# Patient Record
Sex: Female | Born: 1975
Health system: Southern US, Community
[De-identification: ages and names within clinical notes are randomized; demographics above are authoritative.]

## PROBLEM LIST (undated history)

## (undated) DIAGNOSIS — E039 Hypothyroidism, unspecified: Secondary | ICD-10-CM

## (undated) DIAGNOSIS — F419 Anxiety disorder, unspecified: Secondary | ICD-10-CM

## (undated) DIAGNOSIS — Z8739 Personal history of other diseases of the musculoskeletal system and connective tissue: Secondary | ICD-10-CM

## (undated) DIAGNOSIS — M797 Fibromyalgia: Secondary | ICD-10-CM

## (undated) DIAGNOSIS — Z8719 Personal history of other diseases of the digestive system: Secondary | ICD-10-CM

## (undated) DIAGNOSIS — N200 Calculus of kidney: Secondary | ICD-10-CM

## (undated) DIAGNOSIS — K219 Gastro-esophageal reflux disease without esophagitis: Secondary | ICD-10-CM

## (undated) DIAGNOSIS — R51 Headache: Secondary | ICD-10-CM

## (undated) DIAGNOSIS — I10 Essential (primary) hypertension: Secondary | ICD-10-CM

## (undated) HISTORY — PX: ABDOMINAL HYSTERECTOMY: SHX81

## (undated) HISTORY — PX: TOTAL THYROIDECTOMY: SHX2547

## (undated) HISTORY — PX: LITHOTRIPSY: SUR834

## (undated) HISTORY — PX: CHOLECYSTECTOMY: SHX55

---

## 2004-04-29 ENCOUNTER — Encounter: Admission: RE | Admit: 2004-04-29 | Discharge: 2004-04-29 | Payer: Self-pay | Admitting: *Deleted

## 2004-08-12 ENCOUNTER — Encounter: Admission: RE | Admit: 2004-08-12 | Discharge: 2004-08-12 | Payer: Self-pay | Admitting: Neurosurgery

## 2004-09-05 ENCOUNTER — Encounter: Admission: RE | Admit: 2004-09-05 | Discharge: 2004-09-05 | Payer: Self-pay | Admitting: Neurosurgery

## 2004-11-28 ENCOUNTER — Encounter: Admission: RE | Admit: 2004-11-28 | Discharge: 2004-11-28 | Payer: Self-pay | Admitting: Neurosurgery

## 2006-01-18 ENCOUNTER — Ambulatory Visit (HOSPITAL_COMMUNITY): Admission: RE | Admit: 2006-01-18 | Discharge: 2006-01-18 | Payer: Self-pay | Admitting: *Deleted

## 2006-06-11 ENCOUNTER — Emergency Department (HOSPITAL_COMMUNITY): Admission: EM | Admit: 2006-06-11 | Discharge: 2006-06-11 | Payer: Self-pay | Admitting: Emergency Medicine

## 2007-02-01 ENCOUNTER — Encounter: Admission: RE | Admit: 2007-02-01 | Discharge: 2007-02-01 | Payer: Self-pay | Admitting: Neurosurgery

## 2008-07-03 ENCOUNTER — Ambulatory Visit (HOSPITAL_COMMUNITY): Admission: RE | Admit: 2008-07-03 | Discharge: 2008-07-03 | Payer: Self-pay | Admitting: Urology

## 2008-08-07 ENCOUNTER — Encounter: Admission: RE | Admit: 2008-08-07 | Discharge: 2008-08-07 | Payer: Self-pay | Admitting: Neurosurgery

## 2008-08-20 ENCOUNTER — Encounter: Admission: RE | Admit: 2008-08-20 | Discharge: 2008-08-20 | Payer: Self-pay | Admitting: Neurosurgery

## 2008-09-15 ENCOUNTER — Encounter: Admission: RE | Admit: 2008-09-15 | Discharge: 2008-09-15 | Payer: Self-pay | Admitting: Otolaryngology

## 2009-02-12 ENCOUNTER — Encounter: Admission: RE | Admit: 2009-02-12 | Discharge: 2009-02-12 | Payer: Self-pay | Admitting: Neurosurgery

## 2009-02-19 ENCOUNTER — Encounter: Payer: Self-pay | Admitting: Endocrinology

## 2009-02-25 ENCOUNTER — Encounter: Admission: RE | Admit: 2009-02-25 | Discharge: 2009-02-25 | Payer: Self-pay | Admitting: Endocrinology

## 2009-03-25 ENCOUNTER — Encounter: Payer: Self-pay | Admitting: Endocrinology

## 2009-03-26 ENCOUNTER — Encounter: Admission: RE | Admit: 2009-03-26 | Discharge: 2009-03-26 | Payer: Self-pay | Admitting: Endocrinology

## 2009-04-14 ENCOUNTER — Other Ambulatory Visit: Admission: RE | Admit: 2009-04-14 | Discharge: 2009-04-14 | Payer: Self-pay | Admitting: Endocrinology

## 2009-04-26 IMAGING — CT CT NECK W/ CM
3 of 4 series · 16 of 33 positions shown, 19 images · IV contrast (agent unspecified)
Comparison: 01/18/2006

CLINICAL DATA: Submental mass

CT NECK WITH CONTRAST
TECHNIQUE: Multidetector CT imaging of the neck was performed with
intravenous contrast.
Contrast: 75 ml Fmnipaque-AZZ

[Series 2: neck w/ · axial · 0.39mm/px · z∈[+19,+214]mm · 8 of 66 slices shown, 10 images]
[im 7/66  soft-tissue]
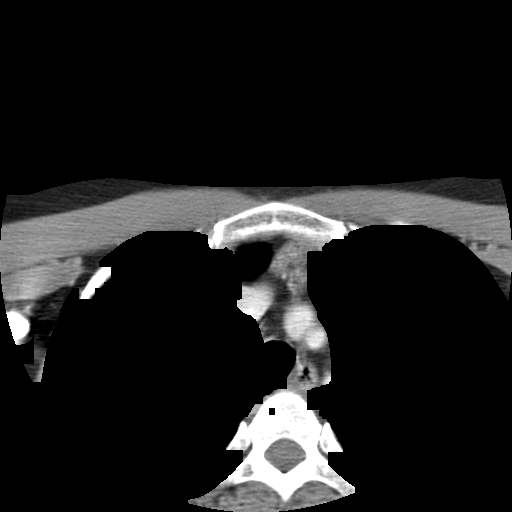
[im 7/66  bone]
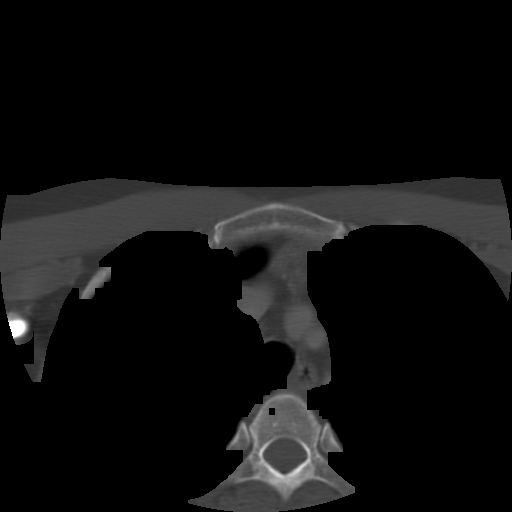
[im 14/66  bone]
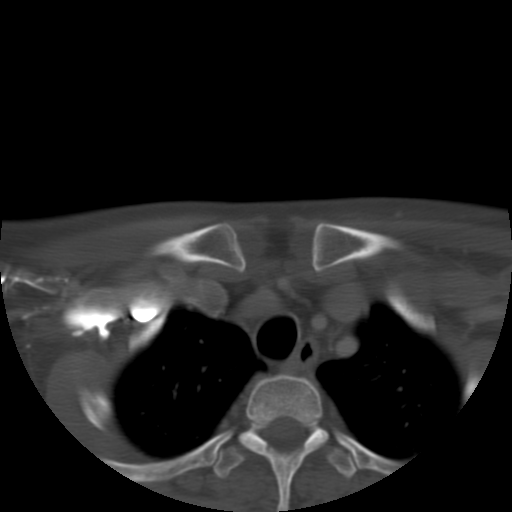
[im 20/66  bone]
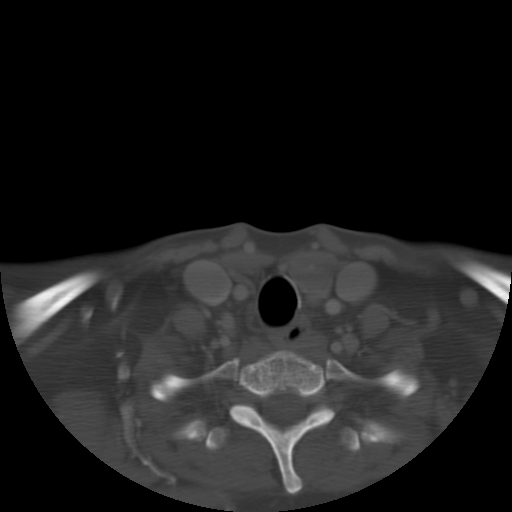
[im 27/66  bone]
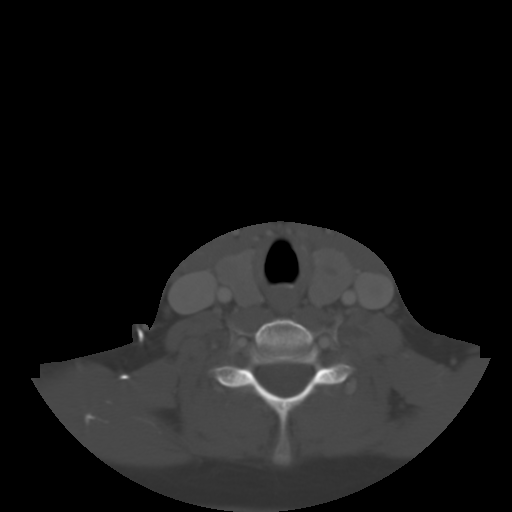
[im 40/66  soft-tissue]
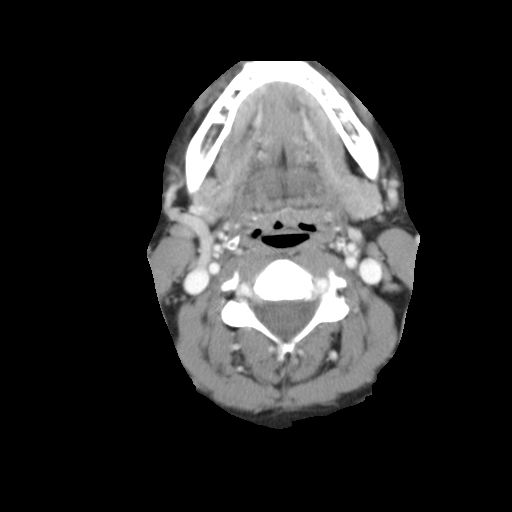
[im 40/66  bone]
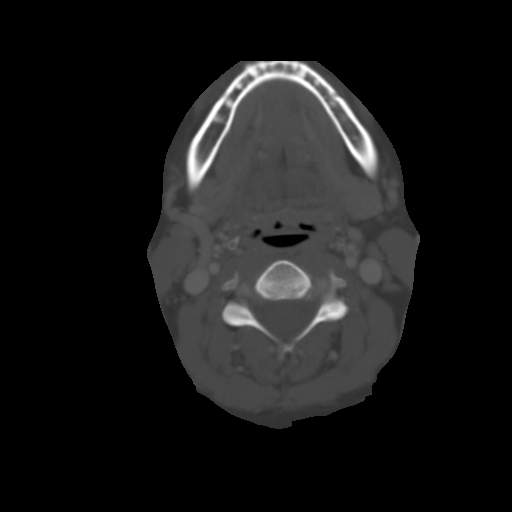
[im 46/66  bone]
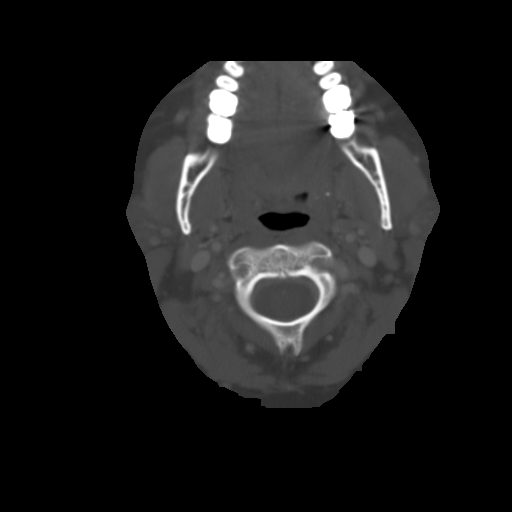
[im 53/66  bone]
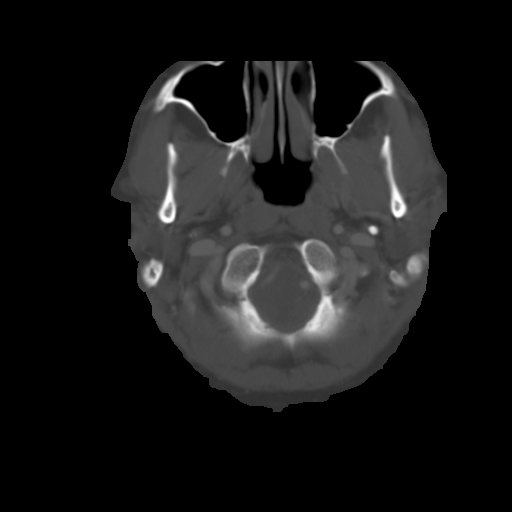
[im 59/66  bone]
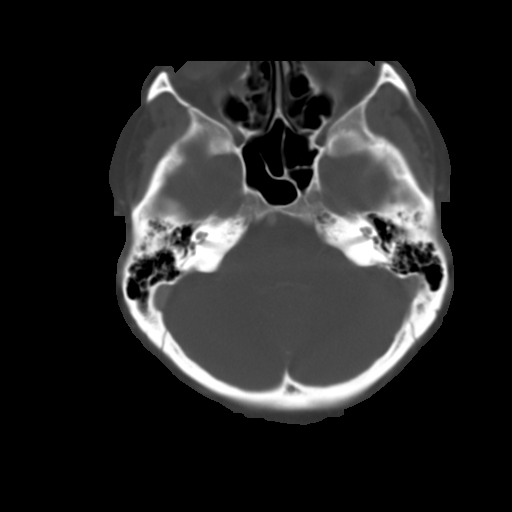

[Series 400: sagittal · sagittal · 0.49mm/px · 5 of 73 slices shown, 6 images]
[im 25/73  bone]
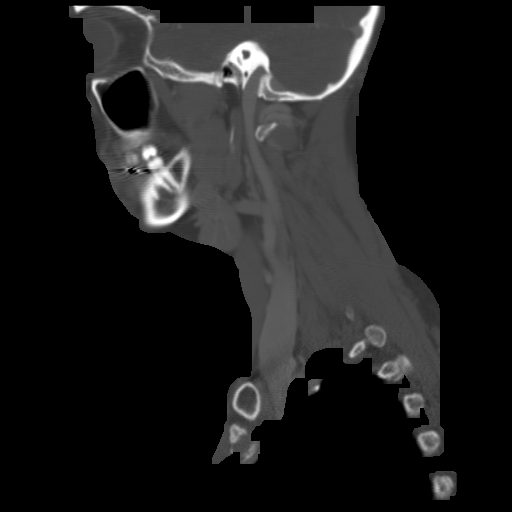
[im 31/73  bone]
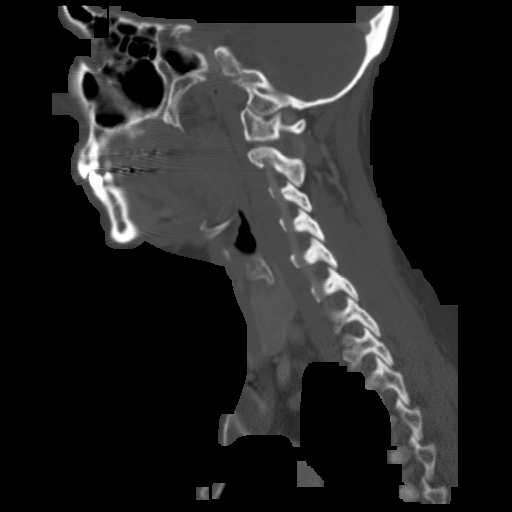
[im 37/73  soft-tissue]
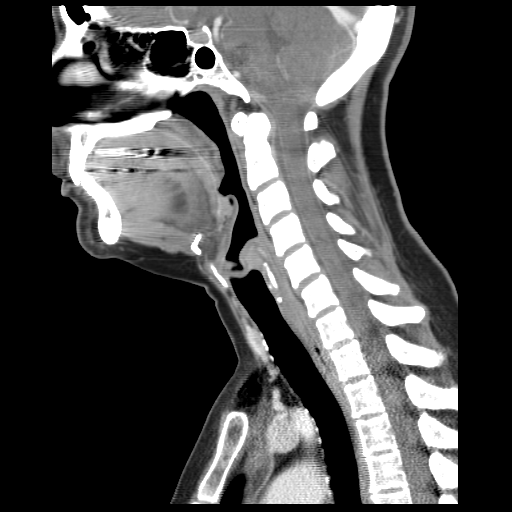
[im 37/73  bone]
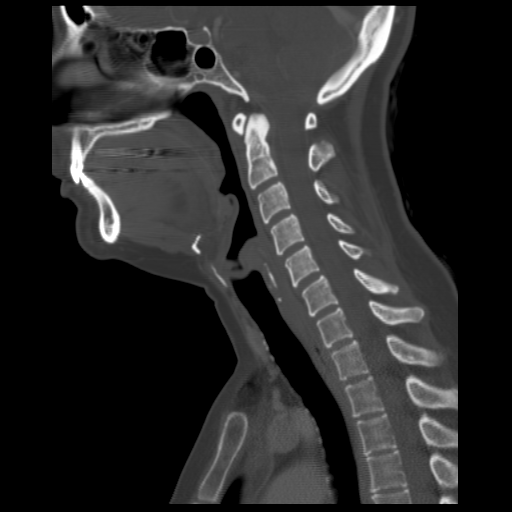
[im 43/73  bone]
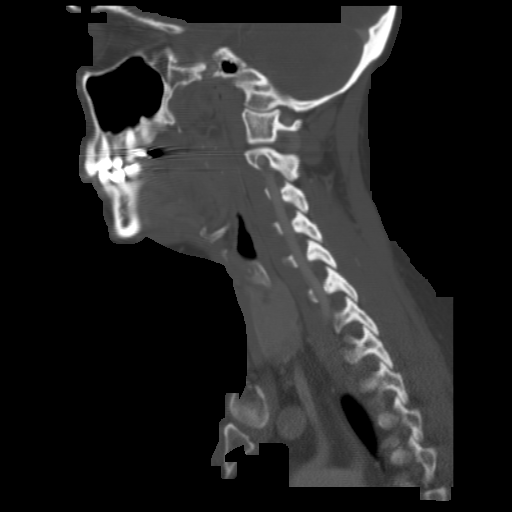
[im 49/73  bone]
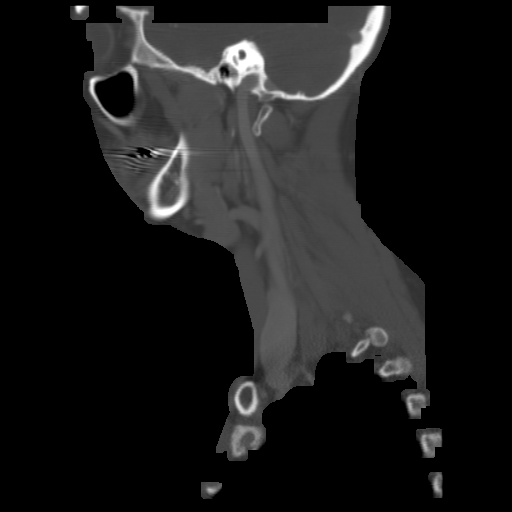

[Series 401: coronal · coronal · 0.49mm/px · 3 of 73 slices shown]
[im 15/73  bone]
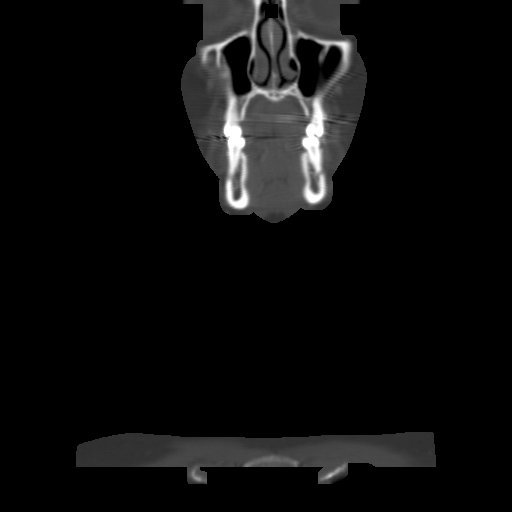
[im 29/73  bone]
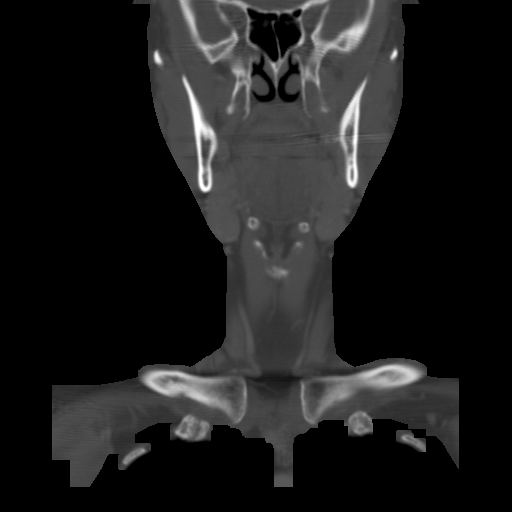
[im 44/73  bone]
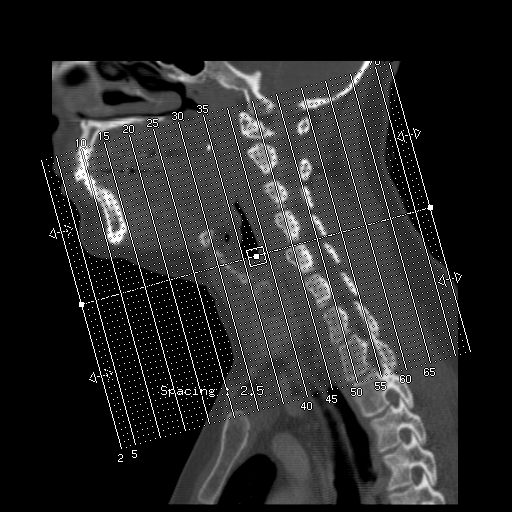

[16 of 33 positions shown; findings below may reference images not displayed]

FINDINGS: Parotid and submandibular glands appear normal.  The
thyroid gland is enlarged and heterogeneous consistent with
multinodular goiter.  These changes were present in 3883.

No pathologically enlarged lymph nodes are seen.  No sign of
submental mass.  No mucosal or submucosal lesion is appreciated.
Vascular structures appear unremarkable.

Visualized intracranial contents appear normal.  Lung apices are
clear.  No superior mediastinal pathology.
IMPRESSION: No submental mass identified.  No sign of cyst, node or other
lesion.

Probable early changes of multinodular goiter.

## 2009-05-28 ENCOUNTER — Encounter: Payer: Self-pay | Admitting: Endocrinology

## 2009-06-08 ENCOUNTER — Encounter: Payer: Self-pay | Admitting: Endocrinology

## 2009-06-11 ENCOUNTER — Encounter: Payer: Self-pay | Admitting: Endocrinology

## 2009-07-30 ENCOUNTER — Ambulatory Visit: Payer: Self-pay | Admitting: Endocrinology

## 2009-07-30 DIAGNOSIS — G43909 Migraine, unspecified, not intractable, without status migrainosus: Secondary | ICD-10-CM | POA: Insufficient documentation

## 2009-07-30 DIAGNOSIS — E039 Hypothyroidism, unspecified: Secondary | ICD-10-CM | POA: Insufficient documentation

## 2009-07-30 DIAGNOSIS — E063 Autoimmune thyroiditis: Secondary | ICD-10-CM | POA: Insufficient documentation

## 2009-07-30 DIAGNOSIS — R599 Enlarged lymph nodes, unspecified: Secondary | ICD-10-CM | POA: Insufficient documentation

## 2009-07-30 DIAGNOSIS — F411 Generalized anxiety disorder: Secondary | ICD-10-CM | POA: Insufficient documentation

## 2009-11-04 IMAGING — US US SOFT TISSUE HEAD/NECK
1 series · 13 of 25 positions shown · non-contrast
Comparison: Ultrasound of the thyroid of 02/25/2009

CLINICAL DATA: Enlarged thyroid on physical exam, follow-up,
patient on Synthroid for over a year

THYROID ULTRASOUND
TECHNIQUE: Ultrasound examination of the thyroid gland and
adjacent soft tissues was performed.

[Series 1: us soft tissue head/neck · 0.09mm/px · 13 of 85 slices shown]
[im 1/85]
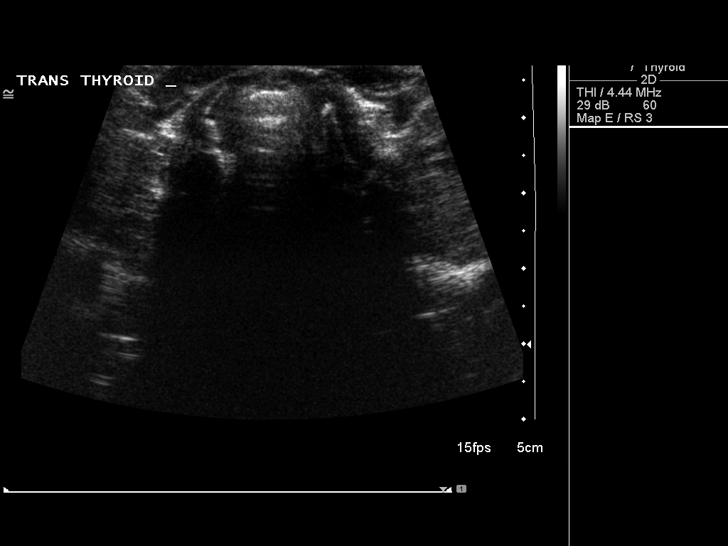
[im 8/85]
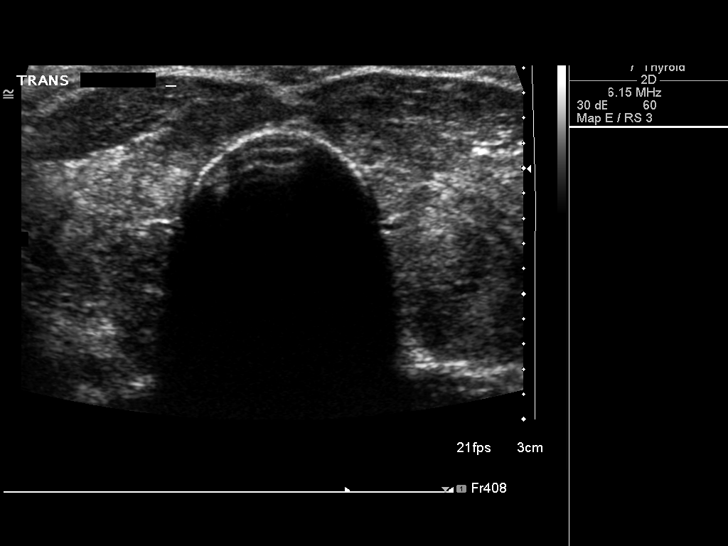
[im 15/85]
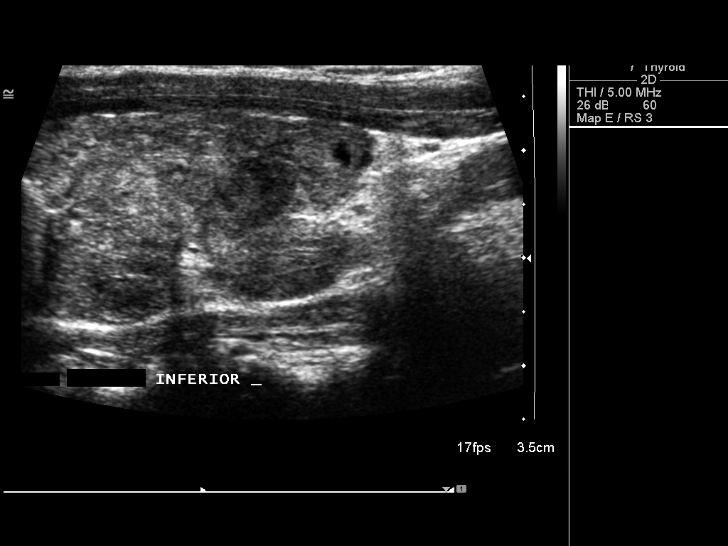
[im 22/85]
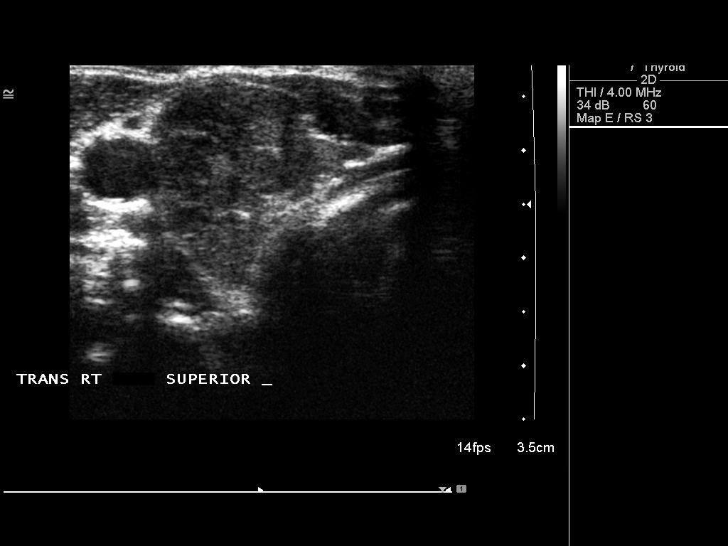
[im 29/85]
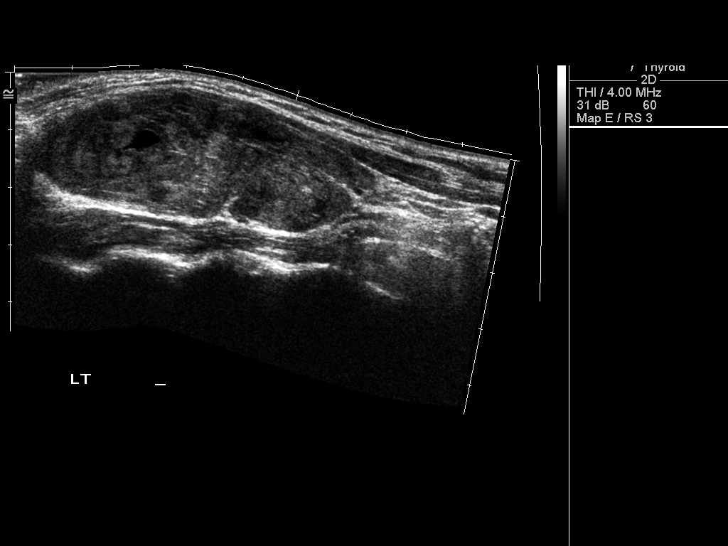
[im 36/85]
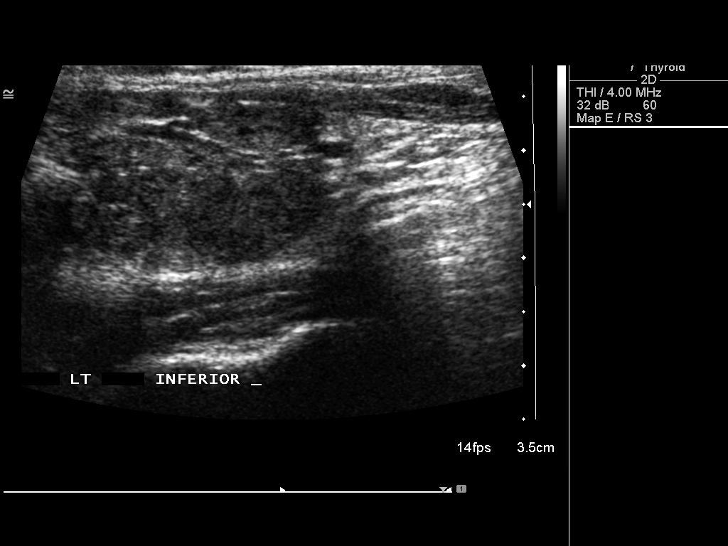
[im 43/85]
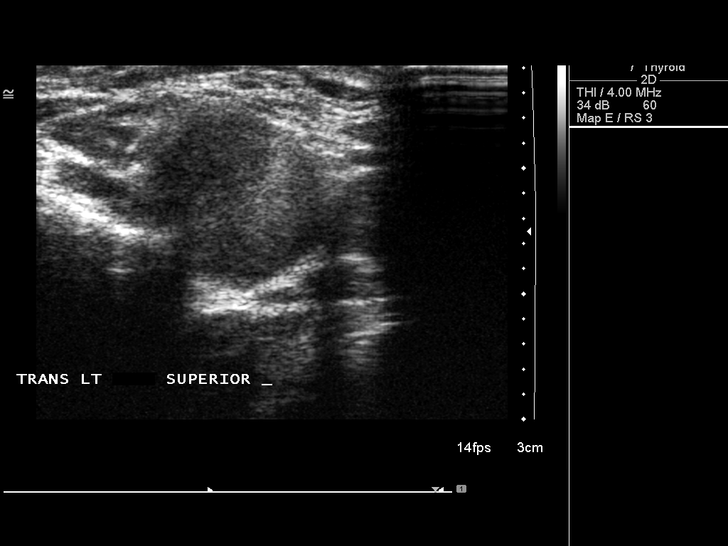
[im 50/85]
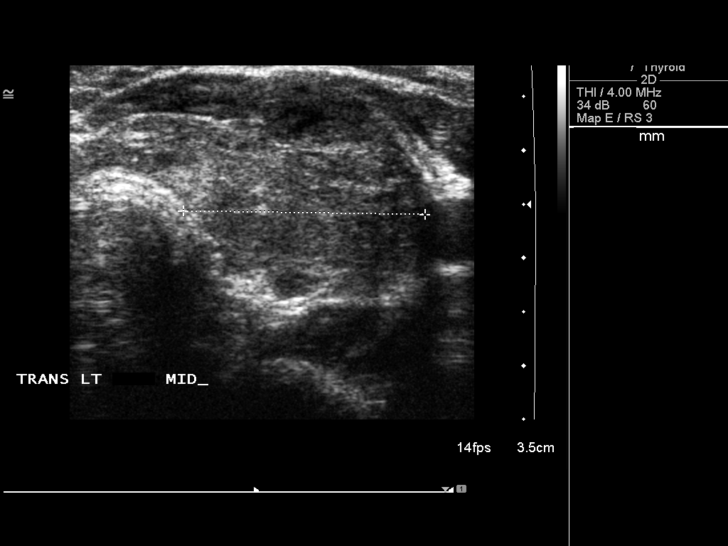
[im 57/85]
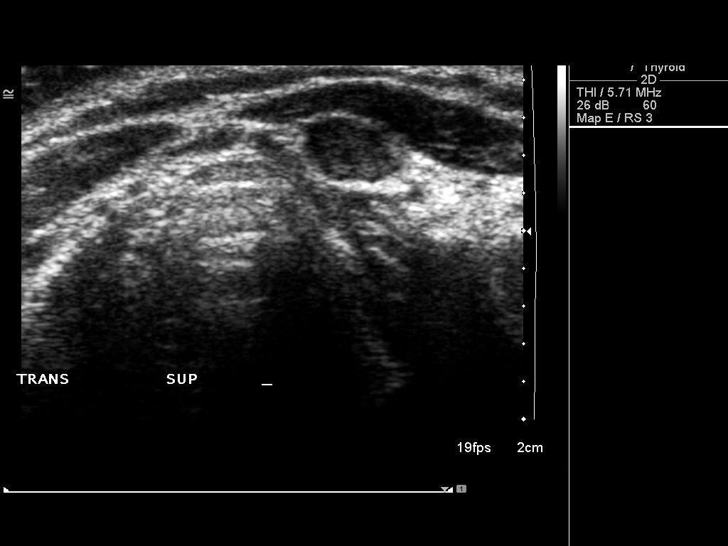
[im 64/85]
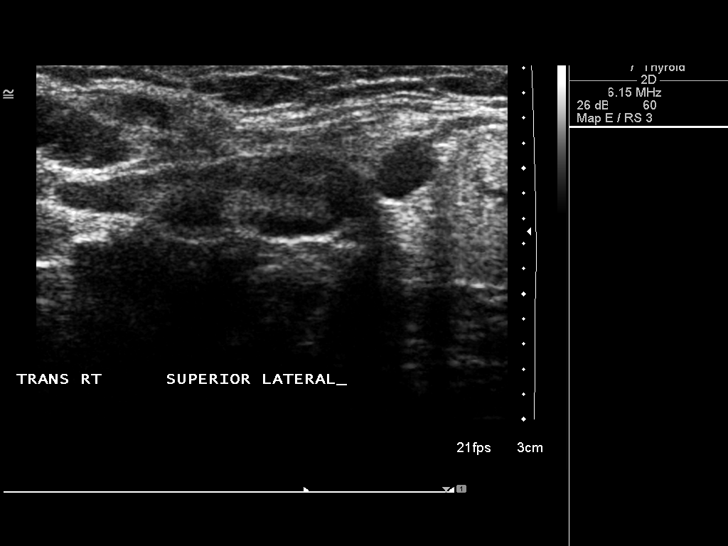
[im 71/85]
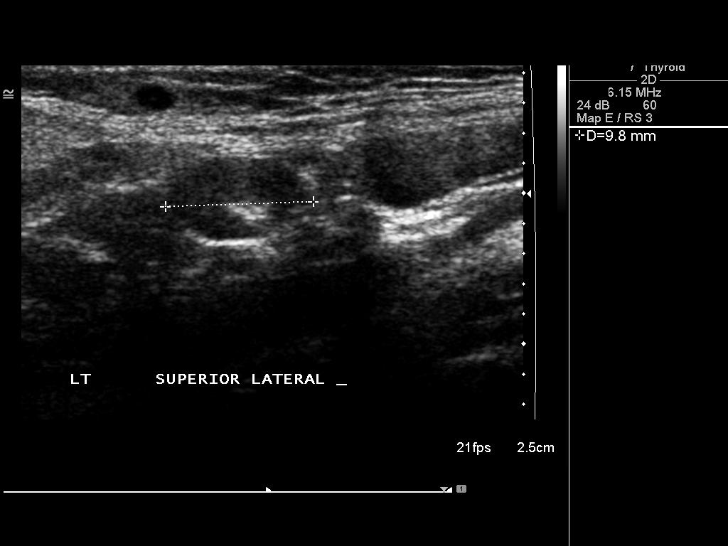
[im 78/85]
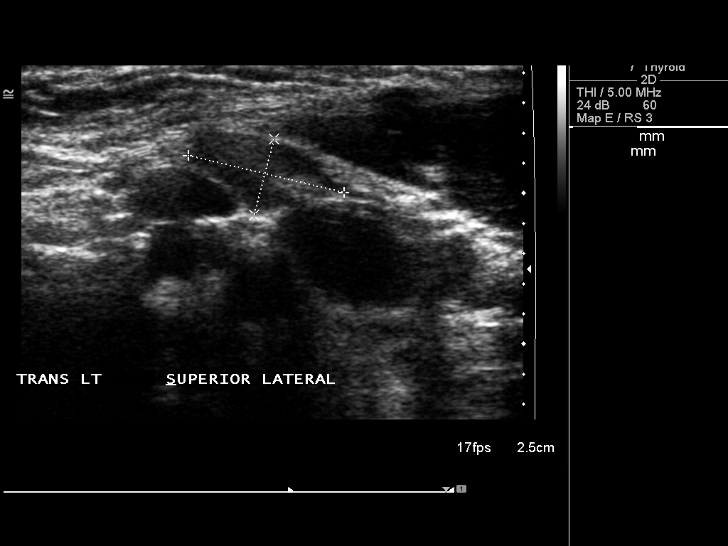
[im 85/85]
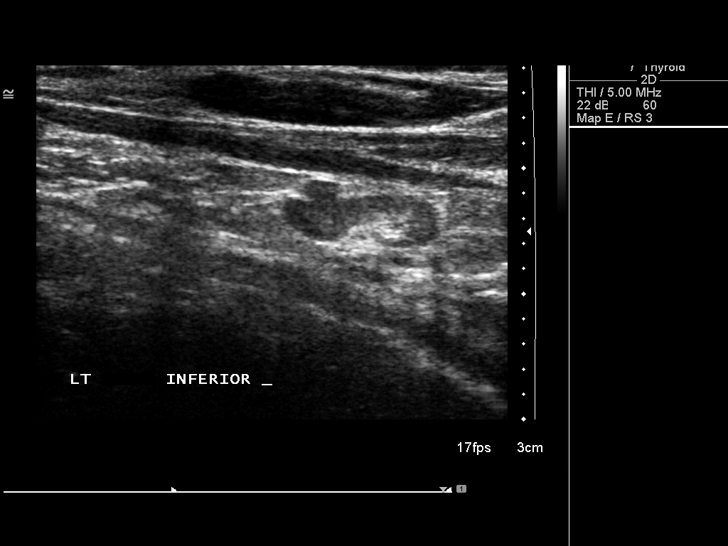

[13 of 25 positions shown; findings below may reference images not displayed]

FINDINGS: The thyroid gland is stable in size.  The right lobe
measures 5.1 x 2.1 x 1.9 cm.  (Prior measurements of 5.1 x 2.0 x
1.8 cm).  The left lobe measures currently 6.1 x 2.3 x 2.3 cm.
(Prior measurements of 5.8 x 2.1 x 2.5 cm.) The isthmus is stable
measuring 4.8 mm in thickness. The gland is very inhomogeneous and
somewhat hypervascular.  A poorly defined hypoechogenic focus is
noted in the upper pole of the left lobe of approximately 10 mm in
maximum diameter.

Prominent lymph nodes are again noted in the neck.  The largest on
the right has increased somewhat in size measuring 2.5 x 0.7 x
cm superior to the thyroid.  The largest on the left also superior
to the thyroid measures 1.9 x 0.5 x 1.1 cm.
IMPRESSION: 1.  No significant change in the size of the thyroid gland which is
nodular, irregular, and hypervascular.  There is a new complex
cystic area in the upper pole of the left of 10 mm in diameter.
 2.  Prominent lymph nodes in the neck as previously noted.

## 2010-01-11 ENCOUNTER — Ambulatory Visit (HOSPITAL_COMMUNITY): Admission: RE | Admit: 2010-01-11 | Discharge: 2010-01-11 | Payer: Self-pay | Admitting: Urology

## 2010-11-11 ENCOUNTER — Encounter
Admission: RE | Admit: 2010-11-11 | Discharge: 2010-11-11 | Payer: Self-pay | Source: Home / Self Care | Attending: Family Medicine | Admitting: Family Medicine

## 2010-11-27 ENCOUNTER — Encounter: Payer: Self-pay | Admitting: Neurosurgery

## 2010-12-02 ENCOUNTER — Encounter
Admission: RE | Admit: 2010-12-02 | Discharge: 2010-12-02 | Payer: Self-pay | Source: Home / Self Care | Attending: Family Medicine | Admitting: Family Medicine

## 2011-04-25 ENCOUNTER — Other Ambulatory Visit: Payer: Self-pay | Admitting: Family Medicine

## 2011-04-25 DIAGNOSIS — M549 Dorsalgia, unspecified: Secondary | ICD-10-CM

## 2011-04-27 ENCOUNTER — Ambulatory Visit
Admission: RE | Admit: 2011-04-27 | Discharge: 2011-04-27 | Disposition: A | Discharge status: Home / Self Care | Payer: BC Managed Care – PPO | Source: Ambulatory Visit | Attending: Family Medicine | Admitting: Family Medicine

## 2011-04-27 DIAGNOSIS — M549 Dorsalgia, unspecified: Secondary | ICD-10-CM

## 2012-01-11 ENCOUNTER — Other Ambulatory Visit: Payer: Self-pay | Admitting: Family Medicine

## 2012-01-11 DIAGNOSIS — M545 Low back pain, unspecified: Secondary | ICD-10-CM

## 2012-02-02 ENCOUNTER — Other Ambulatory Visit: Payer: BC Managed Care – PPO

## 2012-02-29 ENCOUNTER — Other Ambulatory Visit: Payer: BC Managed Care – PPO

## 2012-03-13 ENCOUNTER — Other Ambulatory Visit: Payer: BC Managed Care – PPO

## 2012-03-15 ENCOUNTER — Other Ambulatory Visit: Payer: BC Managed Care – PPO

## 2012-03-15 ENCOUNTER — Ambulatory Visit
Admission: RE | Admit: 2012-03-15 | Discharge: 2012-03-15 | Disposition: A | Discharge status: Home / Self Care | Payer: BC Managed Care – PPO | Source: Ambulatory Visit | Attending: Family Medicine | Admitting: Family Medicine

## 2012-03-15 DIAGNOSIS — M545 Low back pain, unspecified: Secondary | ICD-10-CM

## 2012-03-15 MED ORDER — METHYLPREDNISOLONE ACETATE 40 MG/ML INJ SUSP (RADIOLOG
120.0000 mg | Freq: Once | INTRAMUSCULAR | Status: AC
  Administered 2012-03-15: 120 mg via EPIDURAL

## 2012-03-15 MED ORDER — IOHEXOL 180 MG/ML  SOLN
1.0000 mL | Freq: Once | INTRAMUSCULAR | Status: AC | PRN
  Administered 2012-03-15: 1 mL via EPIDURAL

## 2012-03-15 NOTE — Discharge Instructions (Signed)

## 2013-06-16 ENCOUNTER — Other Ambulatory Visit: Payer: Self-pay | Admitting: Family Medicine

## 2013-06-16 DIAGNOSIS — M5136 Other intervertebral disc degeneration, lumbar region: Secondary | ICD-10-CM

## 2013-06-24 ENCOUNTER — Ambulatory Visit
Admission: RE | Admit: 2013-06-24 | Discharge: 2013-06-24 | Disposition: A | Discharge status: Home / Self Care | Payer: BC Managed Care – PPO | Source: Ambulatory Visit | Attending: Family Medicine | Admitting: Family Medicine

## 2013-06-24 DIAGNOSIS — M5136 Other intervertebral disc degeneration, lumbar region: Secondary | ICD-10-CM

## 2013-06-24 MED ORDER — IOHEXOL 180 MG/ML  SOLN
1.0000 mL | Freq: Once | INTRAMUSCULAR | Status: AC | PRN
  Administered 2013-06-24: 1 mL via EPIDURAL

## 2013-06-24 MED ORDER — METHYLPREDNISOLONE ACETATE 40 MG/ML INJ SUSP (RADIOLOG
120.0000 mg | Freq: Once | INTRAMUSCULAR | Status: AC
  Administered 2013-06-24: 120 mg via EPIDURAL

## 2014-05-25 ENCOUNTER — Encounter (HOSPITAL_COMMUNITY): Payer: Self-pay | Admitting: Pharmacy Technician

## 2014-06-02 ENCOUNTER — Encounter (HOSPITAL_COMMUNITY)
Admission: RE | Admit: 2014-06-02 | Discharge: 2014-06-02 | Disposition: A | Discharge status: Home / Self Care | Payer: BC Managed Care – PPO | Source: Ambulatory Visit | Attending: Obstetrics & Gynecology | Admitting: Obstetrics & Gynecology

## 2014-06-02 ENCOUNTER — Encounter (HOSPITAL_COMMUNITY): Payer: Self-pay

## 2014-06-02 HISTORY — DX: Gastro-esophageal reflux disease without esophagitis: K21.9

## 2014-06-02 HISTORY — DX: Hypothyroidism, unspecified: E03.9

## 2014-06-02 HISTORY — DX: Fibromyalgia: M79.7

## 2014-06-02 HISTORY — DX: Essential (primary) hypertension: I10

## 2014-06-02 HISTORY — DX: Personal history of other diseases of the digestive system: Z87.19

## 2014-06-02 HISTORY — DX: Headache: R51

## 2014-06-02 HISTORY — DX: Calculus of kidney: N20.0

## 2014-06-02 HISTORY — DX: Anxiety disorder, unspecified: F41.9

## 2014-06-02 HISTORY — DX: Personal history of other diseases of the musculoskeletal system and connective tissue: Z87.39

## 2014-06-02 LAB — CBC
HCT: 44.4 % (ref 36.0–46.0)
Hemoglobin: 14.9 g/dL (ref 12.0–15.0)
MCH: 31.6 pg (ref 26.0–34.0)
MCHC: 33.6 g/dL (ref 30.0–36.0)
MCV: 94.3 fL (ref 78.0–100.0)
PLATELETS: 189 10*3/uL (ref 150–400)
RBC: 4.71 MIL/uL (ref 3.87–5.11)
RDW: 12.1 % (ref 11.5–15.5)
WBC: 9.9 10*3/uL (ref 4.0–10.5)

## 2014-06-02 LAB — BASIC METABOLIC PANEL
ANION GAP: 9 (ref 5–15)
BUN: 12 mg/dL (ref 6–23)
CALCIUM: 9.6 mg/dL (ref 8.4–10.5)
CO2: 29 meq/L (ref 19–32)
Chloride: 101 mEq/L (ref 96–112)
Creatinine, Ser: 0.67 mg/dL (ref 0.50–1.10)
GFR calc Af Amer: >90 mL/min (ref >90)
GFR calc non Af Amer: >90 mL/min (ref >90)
Glucose, Bld: 103 mg/dL — ABNORMAL HIGH (ref 70–99)
POTASSIUM: 4.2 meq/L (ref 3.7–5.3)
SODIUM: 139 meq/L (ref 137–147)

## 2014-06-02 NOTE — Patient Instructions (Signed)
Your procedure is scheduled on:06/05/14  Enter through the Main Entrance at :6am Pick up desk phone and dial [phone] and inform us of your arrival.  Please call [phone] if you have any problems the morning of surgery.  Remember: Do not eat food or drink liquids, including water, after midnight:Thursday    You may brush your teeth the morning of surgery.  Take these meds the morning of surgery with a sip of water:thyroid pill, BP pill, GERD pill  DO NOT wear jewelry, eye make-up, lipstick,body lotion, or dark fingernail polish.  (Polished toes are ok) You may wear deodorant.   Patients discharged on the day of surgery will not be allowed to drive home. Wear loose fitting, comfortable clothes for your ride home.

## 2014-06-03 NOTE — H&P (Signed)
Melinda Cannon is an 38 y.o. female with known endometriosis and dyspareunia.  Pelvic ultrasound shows 2.5 cm left adnexal mass and 1 cm vaginal cuff nodule.  Pain did not improve with hormonal therapy (OCPs and Lupron Depot).  She desires surgical management.  Pertinent Gynecological History: Menses: s/p hysterectomy Bleeding: n/a Contraception: status post hysterectomy DES exposure: unknown Blood transfusions: none Sexually transmitted diseases: no past history Previous GYN Procedures: hysterectomy  Last mammogram: none Date: none Last pap: normal Date: 02/2013 OB History: G2, P2  Menstrual History: Menarche age: n/a No LMP recorded. Patient has had a hysterectomy.    Past Medical History  Diagnosis Date  . Kidney stones   . Hypertension   . Hypothyroidism   . GERD (gastroesophageal reflux disease)   . H/O hiatal hernia   . Headache(784.0)   . H/O calcium pyrophosphate deposition disease (CPPD)     causes migraines  . Fibromyalgia   . Anxiety     Past Surgical History  Procedure Laterality Date  . Cholecystectomy    . Total thyroidectomy    . Abdominal hysterectomy    . Lithotripsy      No family history on file.  Social History:  reports that she has been smoking Cigarettes.  She has a 12.5 pack-year smoking history. She has never used smokeless tobacco. She reports that she does not drink alcohol or use illicit drugs.  Allergies:  Allergies  Allergen Reactions  . Ciprofloxacin Rash and Other (See Comments)    Made her feel faint  . Codeine Rash  . Penicillins Rash    No prescriptions prior to admission    ROS  There were no vitals taken for this visit. Physical Exam  Constitutional: She is oriented to person, place, and time. She appears well-developed and well-nourished.  GI: Soft. There is no rebound and no guarding.  Neurological: She is alert and oriented to person, place, and time.  Skin: Skin is warm and dry.  Psychiatric: She has a  normal mood and affect. Her behavior is normal.    No results found for this or any previous visit (from the past 24 hour(s)).  No results found.  Assessment/Plan: 38 yo G2P2 with left adnexal mass and vaginal cuff lesion -Abdominal LSO and removal of vaginal cuff lesion  Tell Rozelle 06/03/2014, 9:06 PM

## 2014-06-04 ENCOUNTER — Encounter (HOSPITAL_COMMUNITY): Payer: Self-pay | Admitting: Anesthesiology

## 2014-06-04 ENCOUNTER — Other Ambulatory Visit (HOSPITAL_COMMUNITY): Payer: BC Managed Care – PPO

## 2014-06-04 MED ORDER — CEFAZOLIN SODIUM-DEXTROSE 2-3 GM-% IV SOLR
2.0000 g | INTRAVENOUS | Status: AC
  Administered 2014-06-05: 2 g via INTRAVENOUS

## 2014-06-04 NOTE — Anesthesia Preprocedure Evaluation (Signed)
Anesthesia Evaluation  Patient identified by MRN, date of birth, ID band Patient awake    Reviewed: Allergy & Precautions, H&P , NPO status , Patient's Chart, lab work & pertinent test results  Airway Mallampati: II TM Distance: >3 FB Neck ROM: Full    Dental no notable dental hx. (+) Teeth Intact   Pulmonary Current Smoker,  breath sounds clear to auscultation  Pulmonary exam normal       Cardiovascular hypertension, Pt. on medications Rhythm:Regular Rate:Normal     Neuro/Psych  Headaches, Anxiety  Neuromuscular disease    GI/Hepatic Neg liver ROS, hiatal hernia, GERD-  Medicated and Controlled,  Endo/Other  Hypothyroidism CPPD ( Calcium Pyrophosphate Disease ) Hx/o Hashimoto's Thyroiditis  Renal/GU Renal diseaseHx/o Renal Calculi  negative genitourinary   Musculoskeletal  (+) Fibromyalgia -  Abdominal   Peds  Hematology   Anesthesia Other Findings   Reproductive/Obstetrics Vaginal cuff lesion Endometriosis Pelvic pain                           Anesthesia Physical Anesthesia Plan  ASA: II  Anesthesia Plan: General   Post-op Pain Management:    Induction: Intravenous and Cricoid pressure planned  Airway Management Planned: Oral ETT  Additional Equipment:   Intra-op Plan:   Post-operative Plan: Extubation in OR  Informed Consent: I have reviewed the patients History and Physical, chart, labs and discussed the procedure including the risks, benefits and alternatives for the proposed anesthesia with the patient or authorized representative who has indicated his/her understanding and acceptance.   Dental advisory given  Plan Discussed with: CRNA, Anesthesiologist and Surgeon  Anesthesia Plan Comments:         Anesthesia Quick Evaluation

## 2014-06-05 ENCOUNTER — Encounter (HOSPITAL_COMMUNITY): Payer: Self-pay

## 2014-06-05 ENCOUNTER — Encounter (HOSPITAL_COMMUNITY)
Admission: RE | Disposition: A | Discharge status: Home / Self Care | Payer: Self-pay | Source: Ambulatory Visit | Attending: Obstetrics & Gynecology

## 2014-06-05 ENCOUNTER — Inpatient Hospital Stay (HOSPITAL_COMMUNITY)
Admission: RE | Admit: 2014-06-05 | Discharge: 2014-06-06 | DRG: 743 | Disposition: A | Discharge status: Home / Self Care | Payer: BC Managed Care – PPO | Source: Ambulatory Visit | Attending: Obstetrics & Gynecology | Admitting: Obstetrics & Gynecology

## 2014-06-05 ENCOUNTER — Encounter (HOSPITAL_COMMUNITY): Payer: BC Managed Care – PPO | Admitting: Anesthesiology

## 2014-06-05 ENCOUNTER — Ambulatory Visit (HOSPITAL_COMMUNITY): Payer: BC Managed Care – PPO | Admitting: Anesthesiology

## 2014-06-05 DIAGNOSIS — N83209 Unspecified ovarian cyst, unspecified side: Secondary | ICD-10-CM | POA: Diagnosis present

## 2014-06-05 DIAGNOSIS — F17200 Nicotine dependence, unspecified, uncomplicated: Secondary | ICD-10-CM | POA: Diagnosis present

## 2014-06-05 DIAGNOSIS — IMO0002 Reserved for concepts with insufficient information to code with codable children: Secondary | ICD-10-CM | POA: Diagnosis present

## 2014-06-05 DIAGNOSIS — Z87442 Personal history of urinary calculi: Secondary | ICD-10-CM

## 2014-06-05 DIAGNOSIS — Z9889 Other specified postprocedural states: Secondary | ICD-10-CM

## 2014-06-05 DIAGNOSIS — IMO0001 Reserved for inherently not codable concepts without codable children: Secondary | ICD-10-CM | POA: Diagnosis present

## 2014-06-05 DIAGNOSIS — Z90710 Acquired absence of both cervix and uterus: Secondary | ICD-10-CM

## 2014-06-05 DIAGNOSIS — K219 Gastro-esophageal reflux disease without esophagitis: Secondary | ICD-10-CM | POA: Diagnosis present

## 2014-06-05 DIAGNOSIS — I10 Essential (primary) hypertension: Secondary | ICD-10-CM | POA: Diagnosis present

## 2014-06-05 DIAGNOSIS — N898 Other specified noninflammatory disorders of vagina: Secondary | ICD-10-CM | POA: Diagnosis present

## 2014-06-05 DIAGNOSIS — Z880 Allergy status to penicillin: Secondary | ICD-10-CM

## 2014-06-05 DIAGNOSIS — F411 Generalized anxiety disorder: Secondary | ICD-10-CM | POA: Diagnosis present

## 2014-06-05 DIAGNOSIS — E039 Hypothyroidism, unspecified: Secondary | ICD-10-CM | POA: Diagnosis present

## 2014-06-05 DIAGNOSIS — N8300 Follicular cyst of ovary, unspecified side: Principal | ICD-10-CM | POA: Diagnosis present

## 2014-06-05 HISTORY — PX: LAPAROSCOPY: SHX197

## 2014-06-05 SURGERY — LAPAROSCOPY OPERATIVE
Anesthesia: General | Site: Abdomen | Laterality: Bilateral

## 2014-06-05 MED ORDER — DEXAMETHASONE SODIUM PHOSPHATE 10 MG/ML IJ SOLN
INTRAMUSCULAR | Status: AC
Start: 2014-06-05 — End: 2014-06-05
  Filled 2014-06-05: qty 1

## 2014-06-05 MED ORDER — ROCURONIUM BROMIDE 100 MG/10ML IV SOLN
INTRAVENOUS | Status: DC | PRN
  Administered 2014-06-05: 30 mg via INTRAVENOUS
  Administered 2014-06-05: 5 mg via INTRAVENOUS

## 2014-06-05 MED ORDER — GLYCOPYRROLATE 0.2 MG/ML IJ SOLN
INTRAMUSCULAR | Status: AC
Start: 2014-06-05 — End: 2014-06-05
  Filled 2014-06-05: qty 2

## 2014-06-05 MED ORDER — HYDROMORPHONE HCL PF 1 MG/ML IJ SOLN
0.2000 mg | INTRAMUSCULAR | Status: DC | PRN
  Administered 2014-06-05 (×2): 0.6 mg via INTRAVENOUS
  Administered 2014-06-05: 0.5 mg via INTRAVENOUS
  Filled 2014-06-05 (×3): qty 1

## 2014-06-05 MED ORDER — MEPERIDINE HCL 25 MG/ML IJ SOLN
INTRAMUSCULAR | Status: AC
  Administered 2014-06-05: 12.5 mg via INTRAVENOUS
  Filled 2014-06-05: qty 1

## 2014-06-05 MED ORDER — ONDANSETRON HCL 4 MG PO TABS: 4.0000 mg | ORAL_TABLET | Freq: Four times a day (QID) | ORAL | Status: DC | PRN

## 2014-06-05 MED ORDER — LACTATED RINGERS IV SOLN
INTRAVENOUS | Status: DC
  Administered 2014-06-05: 09:00:00 via INTRAVENOUS
  Administered 2014-06-05: 10 mL/h via INTRAVENOUS
  Administered 2014-06-05: 08:00:00 via INTRAVENOUS

## 2014-06-05 MED ORDER — NEOSTIGMINE METHYLSULFATE 10 MG/10ML IV SOLN
INTRAVENOUS | Status: DC | PRN
Start: 2014-06-05 — End: 2014-06-05
  Administered 2014-06-05: 3 mg via INTRAVENOUS

## 2014-06-05 MED ORDER — FENTANYL CITRATE 0.05 MG/ML IJ SOLN
INTRAMUSCULAR | Status: AC
  Filled 2014-06-05: qty 2

## 2014-06-05 MED ORDER — HYDROMORPHONE HCL PF 1 MG/ML IJ SOLN
INTRAMUSCULAR | Status: AC
  Filled 2014-06-05: qty 1

## 2014-06-05 MED ORDER — HYDROMORPHONE HCL PF 1 MG/ML IJ SOLN
INTRAMUSCULAR | Status: DC | PRN
  Administered 2014-06-05: 1 mg via INTRAVENOUS

## 2014-06-05 MED ORDER — NEOSTIGMINE METHYLSULFATE 10 MG/10ML IV SOLN
INTRAVENOUS | Status: AC
  Filled 2014-06-05: qty 1

## 2014-06-05 MED ORDER — KETOROLAC TROMETHAMINE 30 MG/ML IJ SOLN
INTRAMUSCULAR | Status: AC
  Filled 2014-06-05: qty 1

## 2014-06-05 MED ORDER — MEPERIDINE HCL 25 MG/ML IJ SOLN
6.2500 mg | INTRAMUSCULAR | Status: DC | PRN
  Administered 2014-06-05 (×2): 12.5 mg via INTRAVENOUS

## 2014-06-05 MED ORDER — AMITRIPTYLINE HCL 50 MG PO TABS
50.0000 mg | ORAL_TABLET | Freq: Every day | ORAL | Status: DC
Start: 2014-06-05 — End: 2014-06-06
  Administered 2014-06-05: 50 mg via ORAL
  Filled 2014-06-05 (×2): qty 1

## 2014-06-05 MED ORDER — PANTOPRAZOLE SODIUM 40 MG PO TBEC
40.0000 mg | DELAYED_RELEASE_TABLET | Freq: Every day | ORAL | Status: DC
  Administered 2014-06-06: 40 mg via ORAL
  Filled 2014-06-05 (×2): qty 1

## 2014-06-05 MED ORDER — ONDANSETRON HCL 4 MG/2ML IJ SOLN: 4.0000 mg | Freq: Four times a day (QID) | INTRAMUSCULAR | Status: DC | PRN

## 2014-06-05 MED ORDER — SCOPOLAMINE 1 MG/3DAYS TD PT72
1.0000 | MEDICATED_PATCH | Freq: Once | TRANSDERMAL | Status: DC
  Administered 2014-06-05: 1.5 mg via TRANSDERMAL

## 2014-06-05 MED ORDER — PNEUMOCOCCAL VAC POLYVALENT 25 MCG/0.5ML IJ INJ
0.5000 mL | INJECTION | INTRAMUSCULAR | Status: AC
  Administered 2014-06-06: 0.5 mL via INTRAMUSCULAR
  Filled 2014-06-05: qty 0.5

## 2014-06-05 MED ORDER — FENTANYL CITRATE 0.05 MG/ML IJ SOLN
25.0000 ug | INTRAMUSCULAR | Status: DC | PRN
  Administered 2014-06-05: 25 ug via INTRAVENOUS

## 2014-06-05 MED ORDER — ONDANSETRON HCL 4 MG/2ML IJ SOLN
INTRAMUSCULAR | Status: AC
  Filled 2014-06-05: qty 2

## 2014-06-05 MED ORDER — EPHEDRINE SULFATE 50 MG/ML IJ SOLN
INTRAMUSCULAR | Status: DC | PRN
  Administered 2014-06-05: 10 mg via INTRAVENOUS

## 2014-06-05 MED ORDER — DEXAMETHASONE SODIUM PHOSPHATE 10 MG/ML IJ SOLN
INTRAMUSCULAR | Status: DC | PRN
  Administered 2014-06-05: 10 mg via INTRAVENOUS

## 2014-06-05 MED ORDER — SCOPOLAMINE 1 MG/3DAYS TD PT72
MEDICATED_PATCH | TRANSDERMAL | Status: AC
  Administered 2014-06-05: 1.5 mg via TRANSDERMAL
  Filled 2014-06-05: qty 1

## 2014-06-05 MED ORDER — DIPHENHYDRAMINE HCL 25 MG PO CAPS
25.0000 mg | ORAL_CAPSULE | Freq: Four times a day (QID) | ORAL | Status: DC | PRN
  Administered 2014-06-05: 25 mg via ORAL
  Filled 2014-06-05: qty 1

## 2014-06-05 MED ORDER — LIDOCAINE HCL (CARDIAC) 20 MG/ML IV SOLN
INTRAVENOUS | Status: AC
Start: 2014-06-05 — End: 2014-06-05
  Filled 2014-06-05: qty 5

## 2014-06-05 MED ORDER — GLYCOPYRROLATE 0.2 MG/ML IJ SOLN
INTRAMUSCULAR | Status: AC
  Filled 2014-06-05: qty 3

## 2014-06-05 MED ORDER — LIDOCAINE HCL (CARDIAC) 20 MG/ML IV SOLN
INTRAVENOUS | Status: DC | PRN
  Administered 2014-06-05: 80 mg via INTRAVENOUS

## 2014-06-05 MED ORDER — KETOROLAC TROMETHAMINE 30 MG/ML IJ SOLN
INTRAMUSCULAR | Status: DC | PRN
  Administered 2014-06-05: 30 mg via INTRAVENOUS

## 2014-06-05 MED ORDER — SCOPOLAMINE 1 MG/3DAYS TD PT72: 1.0000 | MEDICATED_PATCH | TRANSDERMAL | Status: DC

## 2014-06-05 MED ORDER — ONDANSETRON HCL 4 MG/2ML IJ SOLN
INTRAMUSCULAR | Status: DC | PRN
  Administered 2014-06-05: 4 mg via INTRAVENOUS

## 2014-06-05 MED ORDER — DOCUSATE SODIUM 100 MG PO CAPS
100.0000 mg | ORAL_CAPSULE | Freq: Two times a day (BID) | ORAL | Status: DC
  Administered 2014-06-05 – 2014-06-06 (×2): 100 mg via ORAL
  Filled 2014-06-05 (×6): qty 1

## 2014-06-05 MED ORDER — MENTHOL 3 MG MT LOZG
1.0000 | LOZENGE | OROMUCOSAL | Status: DC | PRN
  Filled 2014-06-05: qty 9

## 2014-06-05 MED ORDER — KETOROLAC TROMETHAMINE 30 MG/ML IJ SOLN
30.0000 mg | Freq: Four times a day (QID) | INTRAMUSCULAR | Status: DC
  Administered 2014-06-05 – 2014-06-06 (×3): 30 mg via INTRAVENOUS
  Filled 2014-06-05 (×3): qty 1

## 2014-06-05 MED ORDER — MIDAZOLAM HCL 2 MG/2ML IJ SOLN
INTRAMUSCULAR | Status: DC | PRN
  Administered 2014-06-05: 2 mg via INTRAVENOUS

## 2014-06-05 MED ORDER — ROCURONIUM BROMIDE 100 MG/10ML IV SOLN
INTRAVENOUS | Status: AC
  Filled 2014-06-05: qty 1

## 2014-06-05 MED ORDER — FENTANYL CITRATE 0.05 MG/ML IJ SOLN
INTRAMUSCULAR | Status: DC | PRN
  Administered 2014-06-05: 50 ug via INTRAVENOUS
  Administered 2014-06-05 (×2): 100 ug via INTRAVENOUS

## 2014-06-05 MED ORDER — DIPHENHYDRAMINE HCL 50 MG/ML IJ SOLN
INTRAMUSCULAR | Status: DC | PRN
  Administered 2014-06-05: 12.5 mg via INTRAVENOUS

## 2014-06-05 MED ORDER — BUPIVACAINE HCL (PF) 0.25 % IJ SOLN
INTRAMUSCULAR | Status: AC
  Filled 2014-06-05: qty 30

## 2014-06-05 MED ORDER — SODIUM CHLORIDE 0.9 % IJ SOLN
INTRAMUSCULAR | Status: AC
  Filled 2014-06-05: qty 50

## 2014-06-05 MED ORDER — CEFAZOLIN SODIUM-DEXTROSE 2-3 GM-% IV SOLR
INTRAVENOUS | Status: AC
  Filled 2014-06-05: qty 50

## 2014-06-05 MED ORDER — ALPRAZOLAM 0.25 MG PO TABS
0.2500 mg | ORAL_TABLET | Freq: Three times a day (TID) | ORAL | Status: DC | PRN
  Administered 2014-06-05: 0.25 mg via ORAL
  Filled 2014-06-05: qty 1

## 2014-06-05 MED ORDER — OXYCODONE-ACETAMINOPHEN 5-325 MG PO TABS: 1.0000 | ORAL_TABLET | ORAL | Status: DC | PRN

## 2014-06-05 MED ORDER — HYDROCODONE-ACETAMINOPHEN 5-325 MG PO TABS
1.0000 | ORAL_TABLET | ORAL | Status: DC | PRN
  Administered 2014-06-05 – 2014-06-06 (×3): 2 via ORAL
  Filled 2014-06-05 (×3): qty 2

## 2014-06-05 MED ORDER — PROPOFOL 10 MG/ML IV EMUL
INTRAVENOUS | Status: AC
  Filled 2014-06-05: qty 20

## 2014-06-05 MED ORDER — SIMETHICONE 80 MG PO CHEW
80.0000 mg | CHEWABLE_TABLET | Freq: Four times a day (QID) | ORAL | Status: DC | PRN
  Filled 2014-06-05: qty 1

## 2014-06-05 MED ORDER — LEVOTHYROXINE SODIUM 100 MCG PO TABS
100.0000 ug | ORAL_TABLET | Freq: Every day | ORAL | Status: DC
  Administered 2014-06-06: 100 ug via ORAL
  Filled 2014-06-05: qty 1

## 2014-06-05 MED ORDER — PHENYLEPHRINE HCL 10 MG/ML IJ SOLN
INTRAMUSCULAR | Status: DC | PRN
  Administered 2014-06-05: 80 ug via INTRAVENOUS

## 2014-06-05 MED ORDER — LISINOPRIL 5 MG PO TABS
5.0000 mg | ORAL_TABLET | Freq: Every day | ORAL | Status: DC
  Administered 2014-06-06: 5 mg via ORAL
  Filled 2014-06-05 (×2): qty 1

## 2014-06-05 MED ORDER — NICOTINE 14 MG/24HR TD PT24
14.0000 mg | MEDICATED_PATCH | Freq: Every day | TRANSDERMAL | Status: DC
  Administered 2014-06-05: 14 mg via TRANSDERMAL
  Filled 2014-06-05 (×2): qty 1

## 2014-06-05 MED ORDER — DEXTROSE IN LACTATED RINGERS 5 % IV SOLN: INTRAVENOUS | Status: DC

## 2014-06-05 MED ORDER — PHENYLEPHRINE 40 MCG/ML (10ML) SYRINGE FOR IV PUSH (FOR BLOOD PRESSURE SUPPORT)
PREFILLED_SYRINGE | INTRAVENOUS | Status: AC
  Filled 2014-06-05: qty 5

## 2014-06-05 MED ORDER — PROMETHAZINE HCL 25 MG/ML IJ SOLN: 6.2500 mg | INTRAMUSCULAR | Status: DC | PRN

## 2014-06-05 MED ORDER — GLYCOPYRROLATE 0.2 MG/ML IJ SOLN
INTRAMUSCULAR | Status: DC | PRN
  Administered 2014-06-05: .5 mg via INTRAVENOUS
  Administered 2014-06-05: 0.1 mg via INTRAVENOUS
  Administered 2014-06-05: 0.2 mg via INTRAVENOUS

## 2014-06-05 MED ORDER — MIDAZOLAM HCL 2 MG/2ML IJ SOLN
INTRAMUSCULAR | Status: AC
  Filled 2014-06-05: qty 2

## 2014-06-05 MED ORDER — SODIUM CHLORIDE 0.9 % IV SOLN
INTRAVENOUS | Status: DC | PRN
  Administered 2014-06-05: 08:00:00

## 2014-06-05 MED ORDER — FENTANYL CITRATE 0.05 MG/ML IJ SOLN
INTRAMUSCULAR | Status: AC
  Filled 2014-06-05: qty 5

## 2014-06-05 MED ORDER — BUPIVACAINE LIPOSOME 1.3 % IJ SUSP
20.0000 mL | Freq: Once | INTRAMUSCULAR | Status: DC
  Filled 2014-06-05: qty 20

## 2014-06-05 MED ORDER — EPHEDRINE 5 MG/ML INJ
INTRAVENOUS | Status: AC
  Filled 2014-06-05: qty 10

## 2014-06-05 MED ORDER — SODIUM CHLORIDE 0.9 % IV SOLN: 60.0000 mL | Freq: Once | INTRAVENOUS | Status: DC

## 2014-06-05 MED ORDER — DEXTROSE IN LACTATED RINGERS 5 % IV SOLN
INTRAVENOUS | Status: DC
  Administered 2014-06-05 – 2014-06-06 (×3): via INTRAVENOUS

## 2014-06-05 MED ORDER — KETOROLAC TROMETHAMINE 30 MG/ML IJ SOLN: 30.0000 mg | Freq: Four times a day (QID) | INTRAMUSCULAR | Status: DC

## 2014-06-05 SURGICAL SUPPLY — 57 items
ADH SKN CLS APL DERMABOND .7 (GAUZE/BANDAGES/DRESSINGS) ×1
BAG SPEC RTRVL LRG 6X4 10 (ENDOMECHANICALS)
BARRIER ADHS 3X4 INTERCEED (GAUZE/BANDAGES/DRESSINGS) IMPLANT
BLADE SURG 11 STRL SS (BLADE) ×2 IMPLANT
BRR ADH 4X3 ABS CNTRL BYND (GAUZE/BANDAGES/DRESSINGS)
CABLE HIGH FREQUENCY MONO STRZ (ELECTRODE) IMPLANT
CATH ROBINSON RED A/P 16FR (CATHETERS) ×2 IMPLANT
CELLS DAT CNTRL 66122 CELL SVR (MISCELLANEOUS) ×1 IMPLANT
CHLORAPREP W/TINT 26ML (MISCELLANEOUS) ×2 IMPLANT
CLOTH BEACON ORANGE TIMEOUT ST (SAFETY) ×2 IMPLANT
COVER MAYO STAND STRL (DRAPES) ×1 IMPLANT
DERMABOND ADVANCED (GAUZE/BANDAGES/DRESSINGS) ×1
DERMABOND ADVANCED .7 DNX12 (GAUZE/BANDAGES/DRESSINGS) ×1 IMPLANT
DRESSING OPSITE X SMALL 2X3 (GAUZE/BANDAGES/DRESSINGS) ×1 IMPLANT
DRSG COVADERM PLUS 2X2 (GAUZE/BANDAGES/DRESSINGS) ×3 IMPLANT
DRSG OPSITE POSTOP 3X4 (GAUZE/BANDAGES/DRESSINGS) IMPLANT
DRSG OPSITE POSTOP 4X10 (GAUZE/BANDAGES/DRESSINGS) ×1 IMPLANT
ELECT BLADE 6.5 EXT (BLADE) ×1 IMPLANT
ELECT REM PT RETURN 9FT ADLT (ELECTROSURGICAL) ×2
ELECTRODE REM PT RTRN 9FT ADLT (ELECTROSURGICAL) IMPLANT
ENSEAL DEVICE STD TIP 35CM (ENDOMECHANICALS) IMPLANT
GLOVE BIO SURGEON STRL SZ 6 (GLOVE) ×2 IMPLANT
GLOVE BIOGEL PI IND STRL 6 (GLOVE) ×2 IMPLANT
GLOVE BIOGEL PI IND STRL 7.0 (GLOVE) IMPLANT
GLOVE BIOGEL PI INDICATOR 6 (GLOVE) ×2
GLOVE BIOGEL PI INDICATOR 7.0 (GLOVE) ×4
GLOVE ECLIPSE 7.0 STRL STRAW (GLOVE) ×1 IMPLANT
GOWN PREVENTION PLUS LG XLONG (DISPOSABLE) ×1 IMPLANT
GOWN STRL REUS W/TWL LRG LVL3 (GOWN DISPOSABLE) ×6 IMPLANT
NEEDLE INSUFFLATION 120MM (ENDOMECHANICALS) ×2 IMPLANT
NS IRRIG 1000ML POUR BTL (IV SOLUTION) ×2 IMPLANT
PACK LAPAROSCOPY BASIN (CUSTOM PROCEDURE TRAY) ×2 IMPLANT
PENCIL BUTTON BLDE SNGL 10FT (ELECTRODE) ×1 IMPLANT
POUCH SPECIMEN RETRIEVAL 10MM (ENDOMECHANICALS) IMPLANT
PROTECTOR NERVE ULNAR (MISCELLANEOUS) ×2 IMPLANT
RETRACTOR WND ALEXIS 18 MED (MISCELLANEOUS) IMPLANT
RTRCTR WOUND ALEXIS 18CM MED (MISCELLANEOUS) ×2
SEALER TISSUE G2 CVD JAW 45CM (ENDOMECHANICALS) IMPLANT
SET IRRIG TUBING LAPAROSCOPIC (IRRIGATION / IRRIGATOR) ×1 IMPLANT
SPONGE LAP 18X18 X RAY DECT (DISPOSABLE) ×1 IMPLANT
STRIP CLOSURE SKIN 1/2X4 (GAUZE/BANDAGES/DRESSINGS) IMPLANT
SUT MNCRL AB 3-0 PS2 27 (SUTURE) ×2 IMPLANT
SUT VIC AB 0 CT1 27 (SUTURE) ×18
SUT VIC AB 0 CT1 27XBRD ANBCTR (SUTURE) IMPLANT
SUT VIC AB 2-0 SH 27 (SUTURE) ×2
SUT VIC AB 2-0 SH 27XBRD (SUTURE) IMPLANT
SUT VIC AB 4-0 KS 27 (SUTURE) ×1 IMPLANT
SUT VICRYL 0 TIES 12 18 (SUTURE) ×1 IMPLANT
SUT VICRYL 0 UR6 27IN ABS (SUTURE) ×2 IMPLANT
SYRINGE 60CC LL (MISCELLANEOUS) ×1 IMPLANT
TOWEL OR 17X24 6PK STRL BLUE (TOWEL DISPOSABLE) ×4 IMPLANT
TROCAR XCEL NON-BLD 11X100MML (ENDOMECHANICALS) ×2 IMPLANT
TROCAR XCEL NON-BLD 5MMX100MML (ENDOMECHANICALS) ×4 IMPLANT
TUBING SUCTION BULK 100 FT (MISCELLANEOUS) ×1 IMPLANT
WARMER LAPAROSCOPE (MISCELLANEOUS) ×2 IMPLANT
WATER STERILE IRR 1000ML POUR (IV SOLUTION) ×2 IMPLANT
YANKAUER SUCT BULB TIP NO VENT (SUCTIONS) ×1 IMPLANT

## 2014-06-05 NOTE — Transfer of Care (Signed)
Immediate Anesthesia Transfer of Care Note  Patient: Melinda Cannon  Procedure(s) Performed: Procedure(s): REMOVAL OF LESION ON VAGINAL CUFF,  LEFT SALPINGO-OOPHORECTOMY, RIGHT SALPINGECTOMY. (Bilateral)  Patient Location: PACU  Anesthesia Type:General  Level of Consciousness: awake, alert  and oriented  Airway & Oxygen Therapy: Patient Spontanous Breathing and Patient connected to nasal cannula oxygen  Post-op Assessment: Report given to PACU RN, Post -op Vital signs reviewed and stable and Patient moving all extremities  Post vital signs: Reviewed and stable  Complications: No apparent anesthesia complications

## 2014-06-05 NOTE — Addendum Note (Signed)
Addendum created 06/05/14 1655 by Flossie Dibble, CRNA   Modules edited: Notes Section   Notes Section:  File: [id number]

## 2014-06-05 NOTE — Anesthesia Postprocedure Evaluation (Signed)
  Anesthesia Post Note  Patient: Melinda Cannon  Procedure(s) Performed: Procedure(s) (LRB): REMOVAL OF LESION ON VAGINAL CUFF,  LEFT SALPINGO-OOPHORECTOMY, RIGHT SALPINGECTOMY. (Bilateral)  Anesthesia type: GA  Patient location: PACU  Post pain: Pain level controlled  Post assessment: Post-op Vital signs reviewed  Last Vitals:  Filed Vitals:   06/05/14 0646  BP: 118/76  Pulse: 100  Temp: 36.8 C  Resp: 16    Post vital signs: Reviewed  Level of consciousness: sedated  Complications: No apparent anesthesia complications

## 2014-06-05 NOTE — Anesthesia Postprocedure Evaluation (Signed)
Anesthesia Post Note  Patient: Melinda Cannon  Procedure(s) Performed: Procedure(s): REMOVAL OF LESION ON VAGINAL CUFF,  LEFT SALPINGO-OOPHORECTOMY, RIGHT SALPINGECTOMY. (Bilateral)  Anesthesia type: General  Patient location: [facility]  Post pain: Pain level controlled  Post assessment: Post-op Vital signs reviewed  Last Vitals: BP 134/84  Pulse 101  Temp(Src) 36.7 C (Oral)  Resp 20  Ht 5\' 2"  (1.575 m)  Wt 109 lb (49.442 kg)  BMI 19.93 kg/m2  SpO2 100%  Post vital signs: Reviewed  Level of consciousness: awake  Complications: No apparent anesthesia complications

## 2014-06-05 NOTE — Progress Notes (Signed)
No change to H&P.  Jules Vidovich, DO 

## 2014-06-06 LAB — BASIC METABOLIC PANEL
Anion gap: 9 (ref 5–15)
BUN: 8 mg/dL (ref 6–23)
CO2: 25 mEq/L (ref 19–32)
Calcium: 8.1 mg/dL — ABNORMAL LOW (ref 8.4–10.5)
Chloride: 103 mEq/L (ref 96–112)
Creatinine, Ser: 0.63 mg/dL (ref 0.50–1.10)
Glucose, Bld: 147 mg/dL — ABNORMAL HIGH (ref 70–99)
Potassium: 4.3 mEq/L (ref 3.7–5.3)
SODIUM: 137 meq/L (ref 137–147)

## 2014-06-06 LAB — CBC
HCT: 36.4 % (ref 36.0–46.0)
Hemoglobin: 12 g/dL (ref 12.0–15.0)
MCH: 31 pg (ref 26.0–34.0)
MCHC: 33 g/dL (ref 30.0–36.0)
MCV: 94.1 fL (ref 78.0–100.0)
Platelets: 176 10*3/uL (ref 150–400)
RBC: 3.87 MIL/uL (ref 3.87–5.11)
RDW: 12.1 % (ref 11.5–15.5)
WBC: 18 10*3/uL — ABNORMAL HIGH (ref 4.0–10.5)

## 2014-06-06 MED ORDER — SULFAMETHOXAZOLE-TRIMETHOPRIM 400-80 MG PO TABS: 1.0000 | ORAL_TABLET | Freq: Two times a day (BID) | ORAL | Status: DC

## 2014-06-06 MED ORDER — DSS 100 MG PO CAPS: 100.0000 mg | ORAL_CAPSULE | Freq: Two times a day (BID) | ORAL | Status: DC

## 2014-06-06 MED ORDER — IBUPROFEN 600 MG PO TABS: 600.0000 mg | ORAL_TABLET | Freq: Four times a day (QID) | ORAL | Status: DC | PRN

## 2014-06-06 MED ORDER — HYDROCODONE-ACETAMINOPHEN 5-325 MG PO TABS: 1.0000 | ORAL_TABLET | ORAL | Status: DC | PRN

## 2014-06-06 NOTE — Progress Notes (Signed)
Late entry note (patient was examined 7/31 at 1615)  Patient reports poor pain control with iv dilaudid.  She denies nausea and vomiting.  She denies CP/SOB.  She would like to advance her diet.    VSS.   AF.   UOP >100cc/hr, clear  Gen: A&O x 3 Abd: soft, dressing c/d/i Ext: SCDs on, no c/c/e  POD#0 s/p abdominal bilateral salpingectomy, left oophorectomy, removal of vaginal cuff lesions, lysis of adhesions -Advance diet -Improve pain control -D/C foley and IVF in am -AM labs pending

## 2014-06-06 NOTE — Op Note (Signed)
Melinda Cannon PROCEDURE DATE: 06/05/2014  PREOPERATIVE DIAGNOSIS:  Endometriosis, left adnexal mass, painful vaginal cuff lesion POSTOPERATIVE DIAGNOSIS:  SAA and lysis of adhesions SURGEON:   Dr. Linda Hedges   OPERATION: Abdominal bilateral salpingectomy, left oophorectomy, removal of vaginal cuff lesions ANESTHESIA:  General endotracheal.  INDICATIONS: The patient is a 38 y.o. with history of endometriosis and s/p hysterectomy.  The patient has had worsening dyspareunia refractory to hormonal therapy.  Ultrasound showed 2.5 cm left adnexal mass and 1 cm right vaginal cuff lesion which was the source of the patient's pain.  The patient made a decision to undergo definite surgical treatment. On the preoperative visit, the risks, benefits, indications, and alternatives of the procedure were reviewed with the patient.  On the day of surgery, the risks of surgery were again discussed with the patient including but not limited to: bleeding which may require transfusion or reoperation; infection which may require antibiotics; injury to bowel, bladder, ureters or other surrounding organs; need for additional procedures; thromboembolic phenomenon, incisional problems and other postoperative/anesthesia complications. Written informed consent was obtained.    OPERATIVE FINDINGS: Adhesions involving the bowel to the vaginal cuff and posterior cul-de-sac.  Normal appendix.  Enlarged left ovary.  Normal fallopian tubes bilaterally.  Normal right ovary.  Approximately 2 cm left vaginal cuff lesion and 1 cm right vaginal cuff lesion filled with creamy light yellow material.  ESTIMATED BLOOD LOSS: 100cc SPECIMENS:  1. Bilateral fallopian tubes and left ovary 2. Vaginal cuff lesions sent to pathology COMPLICATIONS:  None immediate.   DESCRIPTION OF PROCEDURE: The patient received intravenous antibiotics and had sequential compression devices applied to her lower extremities while in the preoperative  area.   She was taken to the operating room and placed under general anesthesia without difficulty and found to be adequate.The abdomen and perineum were prepped and draped in a sterile manner, and a Foley catheter was inserted into the bladder and attached to constant drainage. After an adequate timeout was performed, a Pfannensteil skin incision was made. This incision was taken down to the fascia using electrocautery with care given to maintain good hemostasis. The fascia was incised in the midline and the fascial incision was then extended bilaterally using electrocautery without difficulty. The fascia was then dissected off the underlying rectus muscles using blunt and sharp dissection. The rectus muscles were split bluntly in the midline and the peritoneum entered sharply without complication. This peritoneal incision was then extended superiorly and inferiorly with care given to prevent bowel or bladder injury. Upon entry into the abdominal cavity, the upper abdomen was inspected and found to be normal. Attention was then turned to the pelvis. A retractor was placed into the incision, and the bowel was packed away with moist laparotomy sponges.  Adhesions involving the bowel to the vaginal cuff and posterior cul-de-sac were taken down sharply.  The left adnexa was clamped, cut, and the IP was doubly suture ligated with good hemostasis. This procedure was repeated in a similar fashion on the right side to include only the fallopian tube. The bladder was mobilized away from the palpable vaginal cuff lesions.  A clamp was placed below the lesions and they were excised with suture ligation of the base.  From previous lysis of adhesions, 0.5 cm rent overlying the rectum was repaired using a single 2-0 vicryl stitch.  Rectal exam confirmed only superficial involvement.  The pelvis was irrigated and hemostasis was reconfirmed at all pedicles and along the pelvic sidewall.  All laparotomy  sponges and instruments were  removed from the abdomen. The peritoneum was closed with 2-0 monocryl, and the fascia was closed with 0 Vicryl in a running fashion.  Exparel was injection into the subcutanteous tissue. The skin was closed with a 4-0 Monocryl subcuticular stitch. Sponge, lap, needle, and instrument counts were correct times two. The patient was taken to the recovery area awake, extubated and in stable condition.

## 2014-06-06 NOTE — Discharge Summary (Signed)
Physician Discharge Summary  Patient ID: Melinda Cannon MRN: [id number] DOB/AGE: 40-Apr-1977 40 y.o.  Admit date: 06/05/2014 Discharge date: 06/06/2014  Admission Diagnoses:  Dyspareunia, left adnexal mass and vaginal cuff mass  Discharge Diagnoses: SAA Active Problems:   S/P laparotomy   Discharged Condition: good  Hospital Course: The patient was admitted and underwent laparotomy with LSO and right salpingectomy, LOA and removal of vaginal cuff lesions.  The procedure was without complications.  She did well postoperatively and was discharged home on POD#1.  Consults: None  Significant Diagnostic Studies: n/a  Treatments: surgery: abdominal b/l salpingectomy, left oophorectomy, LOA, and vaginal cuff lesion removal  Discharge Exam: Blood pressure 94/62, pulse 100, temperature 99.2 F (37.3 C), temperature source Oral, resp. rate 18, height 5\' 2"  (1.575 m), weight 109 lb (49.442 kg), SpO2 99.00%. General appearance: alert, cooperative and appears stated age GI: ND, soft, inc c/d/i  Disposition: 01-Home or Self Care     Medication List         ALPRAZolam 0.25 MG tablet  Commonly known as:  XANAX  Take 0.25 mg by mouth 3 (three) times daily as needed for anxiety.     amitriptyline 50 MG tablet  Commonly known as:  ELAVIL  Take 50 mg by mouth at bedtime.     aspirin-acetaminophen-caffeine 409-811-91 MG per tablet  Commonly known as:  EXCEDRIN MIGRAINE  Take 1 tablet by mouth every 8 (eight) hours as needed for migraine.     dexlansoprazole 60 MG capsule  Commonly known as:  DEXILANT  Take 60 mg by mouth daily.     DSS 100 MG Caps  Take 100 mg by mouth 2 (two) times daily.     HYDROcodone-acetaminophen 5-325 MG per tablet  Commonly known as:  NORCO/VICODIN  Take 1-2 tablets by mouth every 4 (four) hours as needed for moderate pain.     ibuprofen 600 MG tablet  Commonly known as:  ADVIL  Take 1 tablet (600 mg total) by mouth every 6 (six) hours as  needed.     levothyroxine 100 MCG tablet  Commonly known as:  SYNTHROID, LEVOTHROID  Take 100 mcg by mouth daily before breakfast.     lisinopril 5 MG tablet  Commonly known as:  PRINIVIL,ZESTRIL  Take 5 mg by mouth daily.     sulfamethoxazole-trimethoprim 400-80 MG per tablet  Commonly known as:  BACTRIM  Take 1 tablet by mouth 2 (two) times daily.         SignedLinda Hedges 06/06/2014, 1:24 PM

## 2014-06-06 NOTE — Progress Notes (Signed)
Discharge instructions reviewed with patient and significant other.  Both state understanding of home care, medications, activity, signs/symptoms to report to MD and return MD office visit.  Patients significant other will assist with her care @ home and no home equipment needed.  Patient ambulated for discharge in stable condition with staff without incident.

## 2014-06-06 NOTE — Discharge Instructions (Signed)
Call MD for T>100.4, severe abdominal pain, intractable nausea and/or vomiting, or respiratory distress.  Call office to schedule postop incision check in 1 week.  No heavy lifting or driving.

## 2014-06-06 NOTE — Progress Notes (Signed)
Patient reports good pain control. No nausea or vomiting and tolerating solid diet.  No difficulty with voiding.  Ambulating well.  No flatus.    VSS.  AF. Adequate UOP  Gen A&O x 3 Abd soft, ND, inc c/d/i Ext no c/c/e  POD#1 s/p Laparotomy for b/l salpingectomy, left oophorectomy, vaginal cuff lesion removal and lysis of adhesions -Discharge home

## 2014-06-08 ENCOUNTER — Encounter (HOSPITAL_COMMUNITY): Payer: Self-pay | Admitting: Obstetrics & Gynecology

## 2014-08-03 ENCOUNTER — Ambulatory Visit (INDEPENDENT_AMBULATORY_CARE_PROVIDER_SITE_OTHER): Payer: BC Managed Care – PPO | Admitting: Diagnostic Neuroimaging

## 2014-08-03 ENCOUNTER — Encounter: Payer: Self-pay | Admitting: Diagnostic Neuroimaging

## 2014-08-03 VITALS — Ht 62.0 in | Wt 111.0 lb

## 2014-08-03 DIAGNOSIS — G44221 Chronic tension-type headache, intractable: Secondary | ICD-10-CM

## 2014-08-03 DIAGNOSIS — M542 Cervicalgia: Secondary | ICD-10-CM

## 2014-08-03 DIAGNOSIS — G43019 Migraine without aura, intractable, without status migrainosus: Secondary | ICD-10-CM

## 2014-08-03 DIAGNOSIS — G43709 Chronic migraine without aura, not intractable, without status migrainosus: Secondary | ICD-10-CM

## 2014-08-03 DIAGNOSIS — G44229 Chronic tension-type headache, not intractable: Secondary | ICD-10-CM

## 2014-08-03 DIAGNOSIS — IMO0002 Reserved for concepts with insufficient information to code with codable children: Secondary | ICD-10-CM

## 2014-08-03 MED ORDER — RIZATRIPTAN BENZOATE 10 MG PO TBDP: 10.0000 mg | ORAL_TABLET | ORAL | Status: DC | PRN

## 2014-08-03 MED ORDER — TOPIRAMATE 50 MG PO TABS: 50.0000 mg | ORAL_TABLET | Freq: Two times a day (BID) | ORAL | Status: DC
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
## 2014-08-03 NOTE — Patient Instructions (Signed)
I will check MRIs.  Continue amitriptyline.  Add topiramate 50mg  twice a day.  Add rizatriptan as needed.

## 2014-08-07 DIAGNOSIS — M542 Cervicalgia: Secondary | ICD-10-CM | POA: Insufficient documentation

## 2014-11-02 ENCOUNTER — Ambulatory Visit: Payer: BC Managed Care – PPO | Admitting: Diagnostic Neuroimaging

## 2014-11-17 ENCOUNTER — Other Ambulatory Visit: Payer: Self-pay | Admitting: Obstetrics & Gynecology

## 2014-12-02 ENCOUNTER — Other Ambulatory Visit: Payer: Self-pay | Admitting: Obstetrics & Gynecology

## 2014-12-02 DIAGNOSIS — N644 Mastodynia: Secondary | ICD-10-CM

## 2014-12-08 ENCOUNTER — Other Ambulatory Visit: Payer: Self-pay | Admitting: Family Medicine

## 2014-12-08 DIAGNOSIS — M5136 Other intervertebral disc degeneration, lumbar region: Secondary | ICD-10-CM

## 2014-12-09 ENCOUNTER — Ambulatory Visit
Admission: RE | Admit: 2014-12-09 | Discharge: 2014-12-09 | Disposition: A | Discharge status: Home / Self Care | Payer: Medicare Other | Source: Ambulatory Visit | Attending: Obstetrics & Gynecology | Admitting: Obstetrics & Gynecology

## 2014-12-09 ENCOUNTER — Ambulatory Visit
Admission: RE | Admit: 2014-12-09 | Discharge: 2014-12-09 | Disposition: A | Discharge status: Home / Self Care | Payer: BLUE CROSS/BLUE SHIELD | Source: Ambulatory Visit | Attending: Obstetrics & Gynecology | Admitting: Obstetrics & Gynecology

## 2014-12-09 DIAGNOSIS — N644 Mastodynia: Secondary | ICD-10-CM

## 2014-12-15 ENCOUNTER — Other Ambulatory Visit: Payer: Medicare Other

## 2014-12-15 ENCOUNTER — Ambulatory Visit
Admission: RE | Admit: 2014-12-15 | Discharge: 2014-12-15 | Disposition: A | Discharge status: Home / Self Care | Payer: BLUE CROSS/BLUE SHIELD | Source: Ambulatory Visit | Attending: Family Medicine | Admitting: Family Medicine

## 2014-12-15 DIAGNOSIS — M5136 Other intervertebral disc degeneration, lumbar region: Secondary | ICD-10-CM

## 2014-12-15 MED ORDER — IOHEXOL 180 MG/ML  SOLN
1.0000 mL | Freq: Once | INTRAMUSCULAR | Status: AC | PRN
  Administered 2014-12-15: 1 mL via EPIDURAL

## 2014-12-15 MED ORDER — METHYLPREDNISOLONE ACETATE 40 MG/ML INJ SUSP (RADIOLOG
120.0000 mg | Freq: Once | INTRAMUSCULAR | Status: AC
  Administered 2014-12-15: 120 mg via EPIDURAL

## 2014-12-15 NOTE — Discharge Instructions (Signed)

## 2016-10-20 ENCOUNTER — Other Ambulatory Visit: Payer: Self-pay | Admitting: Obstetrics & Gynecology

## 2016-10-20 DIAGNOSIS — N6320 Unspecified lump in the left breast, unspecified quadrant: Secondary | ICD-10-CM

## 2016-10-27 ENCOUNTER — Other Ambulatory Visit: Payer: Self-pay | Admitting: Obstetrics & Gynecology

## 2016-10-27 ENCOUNTER — Ambulatory Visit
Admission: RE | Admit: 2016-10-27 | Discharge: 2016-10-27 | Disposition: A | Discharge status: Home / Self Care | Payer: Medicare Other | Source: Ambulatory Visit | Attending: Obstetrics & Gynecology | Admitting: Obstetrics & Gynecology

## 2016-10-27 DIAGNOSIS — N6310 Unspecified lump in the right breast, unspecified quadrant: Secondary | ICD-10-CM

## 2016-10-27 DIAGNOSIS — N6320 Unspecified lump in the left breast, unspecified quadrant: Secondary | ICD-10-CM

## 2016-10-27 DIAGNOSIS — R928 Other abnormal and inconclusive findings on diagnostic imaging of breast: Secondary | ICD-10-CM

## 2016-11-03 ENCOUNTER — Other Ambulatory Visit: Payer: Medicare Other

## 2016-11-08 ENCOUNTER — Other Ambulatory Visit: Payer: Self-pay | Admitting: Obstetrics & Gynecology

## 2016-11-08 DIAGNOSIS — R928 Other abnormal and inconclusive findings on diagnostic imaging of breast: Secondary | ICD-10-CM

## 2016-11-09 ENCOUNTER — Inpatient Hospital Stay: Admission: RE | Admit: 2016-11-09 | Payer: Medicare Other | Source: Ambulatory Visit

## 2016-11-15 ENCOUNTER — Inpatient Hospital Stay: Admission: RE | Admit: 2016-11-15 | Payer: Medicare Other | Source: Ambulatory Visit

## 2016-11-22 ENCOUNTER — Other Ambulatory Visit: Payer: Medicare Other

## 2016-12-06 ENCOUNTER — Ambulatory Visit
Admission: RE | Admit: 2016-12-06 | Discharge: 2016-12-06 | Disposition: A | Discharge status: Home / Self Care | Payer: Medicare Other | Source: Ambulatory Visit | Attending: Obstetrics & Gynecology | Admitting: Obstetrics & Gynecology

## 2016-12-06 DIAGNOSIS — R928 Other abnormal and inconclusive findings on diagnostic imaging of breast: Secondary | ICD-10-CM

## 2018-07-10 ENCOUNTER — Other Ambulatory Visit: Payer: Self-pay | Admitting: Family Medicine

## 2018-07-10 DIAGNOSIS — D242 Benign neoplasm of left breast: Secondary | ICD-10-CM

## 2018-07-16 ENCOUNTER — Ambulatory Visit
Admission: RE | Admit: 2018-07-16 | Discharge: 2018-07-16 | Disposition: A | Discharge status: Home / Self Care | Payer: Medicare Other | Source: Ambulatory Visit | Attending: Family Medicine | Admitting: Family Medicine

## 2018-07-16 ENCOUNTER — Ambulatory Visit: Payer: Medicare Other

## 2018-07-16 DIAGNOSIS — D242 Benign neoplasm of left breast: Secondary | ICD-10-CM

## 2018-08-21 ENCOUNTER — Encounter: Payer: Self-pay | Admitting: Endocrinology

## 2019-02-22 DIAGNOSIS — E039 Hypothyroidism, unspecified: Secondary | ICD-10-CM | POA: Diagnosis not present

## 2019-02-22 DIAGNOSIS — K219 Gastro-esophageal reflux disease without esophagitis: Secondary | ICD-10-CM | POA: Diagnosis not present

## 2019-02-22 DIAGNOSIS — I10 Essential (primary) hypertension: Secondary | ICD-10-CM | POA: Diagnosis not present

## 2019-02-22 DIAGNOSIS — Z6822 Body mass index (BMI) 22.0-22.9, adult: Secondary | ICD-10-CM | POA: Diagnosis not present

## 2019-02-22 DIAGNOSIS — R69 Illness, unspecified: Secondary | ICD-10-CM | POA: Diagnosis not present

## 2019-02-22 DIAGNOSIS — E782 Mixed hyperlipidemia: Secondary | ICD-10-CM | POA: Diagnosis not present

## 2019-02-22 DIAGNOSIS — K589 Irritable bowel syndrome without diarrhea: Secondary | ICD-10-CM | POA: Diagnosis not present

## 2019-04-06 DIAGNOSIS — J029 Acute pharyngitis, unspecified: Secondary | ICD-10-CM | POA: Diagnosis not present

## 2019-04-09 DIAGNOSIS — Z1159 Encounter for screening for other viral diseases: Secondary | ICD-10-CM | POA: Diagnosis not present

## 2019-04-09 DIAGNOSIS — E538 Deficiency of other specified B group vitamins: Secondary | ICD-10-CM | POA: Diagnosis not present

## 2019-04-09 DIAGNOSIS — Z0000 Encounter for general adult medical examination without abnormal findings: Secondary | ICD-10-CM | POA: Diagnosis not present

## 2019-04-09 DIAGNOSIS — Z131 Encounter for screening for diabetes mellitus: Secondary | ICD-10-CM | POA: Diagnosis not present

## 2019-04-09 DIAGNOSIS — E890 Postprocedural hypothyroidism: Secondary | ICD-10-CM | POA: Diagnosis not present

## 2019-04-09 DIAGNOSIS — R0602 Shortness of breath: Secondary | ICD-10-CM | POA: Diagnosis not present

## 2019-04-09 DIAGNOSIS — E559 Vitamin D deficiency, unspecified: Secondary | ICD-10-CM | POA: Diagnosis not present

## 2019-04-18 DIAGNOSIS — Z01419 Encounter for gynecological examination (general) (routine) without abnormal findings: Secondary | ICD-10-CM | POA: Diagnosis not present

## 2019-04-18 DIAGNOSIS — R102 Pelvic and perineal pain: Secondary | ICD-10-CM | POA: Diagnosis not present

## 2019-04-18 DIAGNOSIS — Z124 Encounter for screening for malignant neoplasm of cervix: Secondary | ICD-10-CM | POA: Diagnosis not present

## 2019-04-18 DIAGNOSIS — Z6823 Body mass index (BMI) 23.0-23.9, adult: Secondary | ICD-10-CM | POA: Diagnosis not present

## 2019-04-23 DIAGNOSIS — E7800 Pure hypercholesterolemia, unspecified: Secondary | ICD-10-CM | POA: Diagnosis not present

## 2019-04-23 DIAGNOSIS — E559 Vitamin D deficiency, unspecified: Secondary | ICD-10-CM | POA: Diagnosis not present

## 2019-04-23 DIAGNOSIS — E890 Postprocedural hypothyroidism: Secondary | ICD-10-CM | POA: Diagnosis not present

## 2019-04-29 DIAGNOSIS — I10 Essential (primary) hypertension: Secondary | ICD-10-CM | POA: Diagnosis not present

## 2019-05-06 DIAGNOSIS — I10 Essential (primary) hypertension: Secondary | ICD-10-CM | POA: Diagnosis not present

## 2019-05-13 DIAGNOSIS — J328 Other chronic sinusitis: Secondary | ICD-10-CM | POA: Diagnosis not present

## 2019-05-13 DIAGNOSIS — R51 Headache: Secondary | ICD-10-CM | POA: Diagnosis not present

## 2019-05-13 DIAGNOSIS — I10 Essential (primary) hypertension: Secondary | ICD-10-CM | POA: Diagnosis not present

## 2019-06-03 DIAGNOSIS — J328 Other chronic sinusitis: Secondary | ICD-10-CM | POA: Diagnosis not present

## 2019-06-03 DIAGNOSIS — I10 Essential (primary) hypertension: Secondary | ICD-10-CM | POA: Diagnosis not present

## 2019-06-03 DIAGNOSIS — R51 Headache: Secondary | ICD-10-CM | POA: Diagnosis not present

## 2019-08-06 DIAGNOSIS — Z1159 Encounter for screening for other viral diseases: Secondary | ICD-10-CM | POA: Diagnosis not present

## 2019-08-06 DIAGNOSIS — Z79899 Other long term (current) drug therapy: Secondary | ICD-10-CM | POA: Diagnosis not present

## 2019-08-06 DIAGNOSIS — K219 Gastro-esophageal reflux disease without esophagitis: Secondary | ICD-10-CM | POA: Diagnosis not present

## 2019-08-06 DIAGNOSIS — E559 Vitamin D deficiency, unspecified: Secondary | ICD-10-CM | POA: Diagnosis not present

## 2019-08-06 DIAGNOSIS — E7800 Pure hypercholesterolemia, unspecified: Secondary | ICD-10-CM | POA: Diagnosis not present

## 2019-08-13 DIAGNOSIS — J343 Hypertrophy of nasal turbinates: Secondary | ICD-10-CM | POA: Diagnosis not present

## 2019-08-13 DIAGNOSIS — R0982 Postnasal drip: Secondary | ICD-10-CM | POA: Diagnosis not present

## 2019-10-04 DIAGNOSIS — M19071 Primary osteoarthritis, right ankle and foot: Secondary | ICD-10-CM | POA: Diagnosis not present

## 2019-10-04 DIAGNOSIS — E782 Mixed hyperlipidemia: Secondary | ICD-10-CM | POA: Diagnosis not present

## 2019-10-04 DIAGNOSIS — K589 Irritable bowel syndrome without diarrhea: Secondary | ICD-10-CM | POA: Diagnosis not present

## 2019-10-04 DIAGNOSIS — E039 Hypothyroidism, unspecified: Secondary | ICD-10-CM | POA: Diagnosis not present

## 2019-10-04 DIAGNOSIS — I10 Essential (primary) hypertension: Secondary | ICD-10-CM | POA: Diagnosis not present

## 2019-10-04 DIAGNOSIS — Z6821 Body mass index (BMI) 21.0-21.9, adult: Secondary | ICD-10-CM | POA: Diagnosis not present

## 2019-10-04 DIAGNOSIS — R69 Illness, unspecified: Secondary | ICD-10-CM | POA: Diagnosis not present

## 2019-11-19 DIAGNOSIS — Z79899 Other long term (current) drug therapy: Secondary | ICD-10-CM | POA: Diagnosis not present

## 2019-11-19 DIAGNOSIS — E7800 Pure hypercholesterolemia, unspecified: Secondary | ICD-10-CM | POA: Diagnosis not present

## 2019-11-19 DIAGNOSIS — I10 Essential (primary) hypertension: Secondary | ICD-10-CM | POA: Diagnosis not present

## 2019-11-19 DIAGNOSIS — M79604 Pain in right leg: Secondary | ICD-10-CM | POA: Diagnosis not present

## 2019-11-19 DIAGNOSIS — Z1159 Encounter for screening for other viral diseases: Secondary | ICD-10-CM | POA: Diagnosis not present

## 2019-12-04 DIAGNOSIS — M79604 Pain in right leg: Secondary | ICD-10-CM | POA: Diagnosis not present

## 2019-12-04 DIAGNOSIS — M79605 Pain in left leg: Secondary | ICD-10-CM | POA: Diagnosis not present

## 2019-12-04 DIAGNOSIS — I8393 Asymptomatic varicose veins of bilateral lower extremities: Secondary | ICD-10-CM | POA: Diagnosis not present

## 2019-12-23 DIAGNOSIS — R69 Illness, unspecified: Secondary | ICD-10-CM | POA: Diagnosis not present

## 2019-12-30 DIAGNOSIS — H52221 Regular astigmatism, right eye: Secondary | ICD-10-CM | POA: Diagnosis not present

## 2019-12-30 DIAGNOSIS — Z0100 Encounter for examination of eyes and vision without abnormal findings: Secondary | ICD-10-CM | POA: Diagnosis not present

## 2019-12-30 DIAGNOSIS — H5201 Hypermetropia, right eye: Secondary | ICD-10-CM | POA: Diagnosis not present

## 2019-12-30 DIAGNOSIS — H53413 Scotoma involving central area, bilateral: Secondary | ICD-10-CM | POA: Diagnosis not present

## 2019-12-30 DIAGNOSIS — H52223 Regular astigmatism, bilateral: Secondary | ICD-10-CM | POA: Diagnosis not present

## 2019-12-30 DIAGNOSIS — H524 Presbyopia: Secondary | ICD-10-CM | POA: Diagnosis not present

## 2020-01-24 DIAGNOSIS — J301 Allergic rhinitis due to pollen: Secondary | ICD-10-CM | POA: Diagnosis not present

## 2020-01-24 DIAGNOSIS — M19071 Primary osteoarthritis, right ankle and foot: Secondary | ICD-10-CM | POA: Diagnosis not present

## 2020-01-24 DIAGNOSIS — I10 Essential (primary) hypertension: Secondary | ICD-10-CM | POA: Diagnosis not present

## 2020-01-24 DIAGNOSIS — K589 Irritable bowel syndrome without diarrhea: Secondary | ICD-10-CM | POA: Diagnosis not present

## 2020-01-24 DIAGNOSIS — R69 Illness, unspecified: Secondary | ICD-10-CM | POA: Diagnosis not present

## 2020-01-24 DIAGNOSIS — K219 Gastro-esophageal reflux disease without esophagitis: Secondary | ICD-10-CM | POA: Diagnosis not present

## 2020-01-24 DIAGNOSIS — Z6823 Body mass index (BMI) 23.0-23.9, adult: Secondary | ICD-10-CM | POA: Diagnosis not present

## 2020-01-24 DIAGNOSIS — M797 Fibromyalgia: Secondary | ICD-10-CM | POA: Diagnosis not present

## 2020-01-24 DIAGNOSIS — E782 Mixed hyperlipidemia: Secondary | ICD-10-CM | POA: Diagnosis not present

## 2020-01-24 DIAGNOSIS — E039 Hypothyroidism, unspecified: Secondary | ICD-10-CM | POA: Diagnosis not present

## 2020-02-14 DIAGNOSIS — E039 Hypothyroidism, unspecified: Secondary | ICD-10-CM | POA: Diagnosis not present

## 2020-03-08 DIAGNOSIS — M79651 Pain in right thigh: Secondary | ICD-10-CM | POA: Diagnosis not present

## 2020-03-08 DIAGNOSIS — M79652 Pain in left thigh: Secondary | ICD-10-CM | POA: Diagnosis not present

## 2020-03-08 DIAGNOSIS — M79604 Pain in right leg: Secondary | ICD-10-CM | POA: Diagnosis not present

## 2020-03-08 DIAGNOSIS — M79605 Pain in left leg: Secondary | ICD-10-CM | POA: Diagnosis not present

## 2020-03-10 DIAGNOSIS — E559 Vitamin D deficiency, unspecified: Secondary | ICD-10-CM | POA: Diagnosis not present

## 2020-03-10 DIAGNOSIS — E7800 Pure hypercholesterolemia, unspecified: Secondary | ICD-10-CM | POA: Diagnosis not present

## 2020-03-10 DIAGNOSIS — E039 Hypothyroidism, unspecified: Secondary | ICD-10-CM | POA: Diagnosis not present

## 2020-03-10 DIAGNOSIS — G8929 Other chronic pain: Secondary | ICD-10-CM | POA: Diagnosis not present

## 2020-03-10 DIAGNOSIS — Z1159 Encounter for screening for other viral diseases: Secondary | ICD-10-CM | POA: Diagnosis not present

## 2020-03-10 DIAGNOSIS — M542 Cervicalgia: Secondary | ICD-10-CM | POA: Diagnosis not present

## 2020-03-10 DIAGNOSIS — I10 Essential (primary) hypertension: Secondary | ICD-10-CM | POA: Diagnosis not present

## 2020-04-12 DIAGNOSIS — R69 Illness, unspecified: Secondary | ICD-10-CM | POA: Diagnosis not present

## 2020-04-28 DIAGNOSIS — M79604 Pain in right leg: Secondary | ICD-10-CM | POA: Diagnosis not present

## 2020-04-28 DIAGNOSIS — M79651 Pain in right thigh: Secondary | ICD-10-CM | POA: Diagnosis not present

## 2020-05-06 DIAGNOSIS — I8311 Varicose veins of right lower extremity with inflammation: Secondary | ICD-10-CM | POA: Diagnosis not present

## 2020-05-06 DIAGNOSIS — I83811 Varicose veins of right lower extremities with pain: Secondary | ICD-10-CM | POA: Diagnosis not present

## 2020-05-14 DIAGNOSIS — E039 Hypothyroidism, unspecified: Secondary | ICD-10-CM | POA: Diagnosis not present

## 2020-05-14 DIAGNOSIS — R69 Illness, unspecified: Secondary | ICD-10-CM | POA: Diagnosis not present

## 2020-05-14 DIAGNOSIS — M19071 Primary osteoarthritis, right ankle and foot: Secondary | ICD-10-CM | POA: Diagnosis not present

## 2020-05-14 DIAGNOSIS — K219 Gastro-esophageal reflux disease without esophagitis: Secondary | ICD-10-CM | POA: Diagnosis not present

## 2020-05-14 DIAGNOSIS — K589 Irritable bowel syndrome without diarrhea: Secondary | ICD-10-CM | POA: Diagnosis not present

## 2020-05-14 DIAGNOSIS — E782 Mixed hyperlipidemia: Secondary | ICD-10-CM | POA: Diagnosis not present

## 2020-05-14 DIAGNOSIS — M797 Fibromyalgia: Secondary | ICD-10-CM | POA: Diagnosis not present

## 2020-05-14 DIAGNOSIS — I10 Essential (primary) hypertension: Secondary | ICD-10-CM | POA: Diagnosis not present

## 2020-06-08 DIAGNOSIS — E7800 Pure hypercholesterolemia, unspecified: Secondary | ICD-10-CM | POA: Diagnosis not present

## 2020-06-08 DIAGNOSIS — I10 Essential (primary) hypertension: Secondary | ICD-10-CM | POA: Diagnosis not present

## 2020-06-08 DIAGNOSIS — E559 Vitamin D deficiency, unspecified: Secondary | ICD-10-CM | POA: Diagnosis not present

## 2020-06-08 DIAGNOSIS — E039 Hypothyroidism, unspecified: Secondary | ICD-10-CM | POA: Diagnosis not present

## 2020-06-22 DIAGNOSIS — I83811 Varicose veins of right lower extremities with pain: Secondary | ICD-10-CM | POA: Diagnosis not present

## 2020-06-22 DIAGNOSIS — I8311 Varicose veins of right lower extremity with inflammation: Secondary | ICD-10-CM | POA: Diagnosis not present

## 2020-07-07 DIAGNOSIS — R69 Illness, unspecified: Secondary | ICD-10-CM | POA: Diagnosis not present

## 2020-08-07 DIAGNOSIS — E039 Hypothyroidism, unspecified: Secondary | ICD-10-CM | POA: Diagnosis not present

## 2020-08-07 DIAGNOSIS — M19071 Primary osteoarthritis, right ankle and foot: Secondary | ICD-10-CM | POA: Diagnosis not present

## 2020-08-07 DIAGNOSIS — K219 Gastro-esophageal reflux disease without esophagitis: Secondary | ICD-10-CM | POA: Diagnosis not present

## 2020-08-07 DIAGNOSIS — E782 Mixed hyperlipidemia: Secondary | ICD-10-CM | POA: Diagnosis not present

## 2020-08-07 DIAGNOSIS — J301 Allergic rhinitis due to pollen: Secondary | ICD-10-CM | POA: Diagnosis not present

## 2020-08-07 DIAGNOSIS — K589 Irritable bowel syndrome without diarrhea: Secondary | ICD-10-CM | POA: Diagnosis not present

## 2020-08-07 DIAGNOSIS — M797 Fibromyalgia: Secondary | ICD-10-CM | POA: Diagnosis not present

## 2020-08-07 DIAGNOSIS — Z7189 Other specified counseling: Secondary | ICD-10-CM | POA: Diagnosis not present

## 2020-08-07 DIAGNOSIS — I10 Essential (primary) hypertension: Secondary | ICD-10-CM | POA: Diagnosis not present

## 2020-08-07 DIAGNOSIS — R69 Illness, unspecified: Secondary | ICD-10-CM | POA: Diagnosis not present

## 2020-09-15 DIAGNOSIS — Z01419 Encounter for gynecological examination (general) (routine) without abnormal findings: Secondary | ICD-10-CM | POA: Diagnosis not present

## 2020-09-15 DIAGNOSIS — N803 Endometriosis of pelvic peritoneum: Secondary | ICD-10-CM | POA: Diagnosis not present

## 2020-09-15 DIAGNOSIS — Z6824 Body mass index (BMI) 24.0-24.9, adult: Secondary | ICD-10-CM | POA: Diagnosis not present

## 2020-12-17 DIAGNOSIS — L670 Trichorrhexis nodosa: Secondary | ICD-10-CM | POA: Diagnosis not present

## 2021-01-04 DIAGNOSIS — L670 Trichorrhexis nodosa: Secondary | ICD-10-CM | POA: Diagnosis not present

## 2021-02-24 DIAGNOSIS — E559 Vitamin D deficiency, unspecified: Secondary | ICD-10-CM | POA: Diagnosis not present

## 2021-02-24 DIAGNOSIS — I10 Essential (primary) hypertension: Secondary | ICD-10-CM | POA: Diagnosis not present

## 2021-02-24 DIAGNOSIS — Z0000 Encounter for general adult medical examination without abnormal findings: Secondary | ICD-10-CM | POA: Diagnosis not present

## 2021-02-24 DIAGNOSIS — Z131 Encounter for screening for diabetes mellitus: Secondary | ICD-10-CM | POA: Diagnosis not present

## 2021-02-24 DIAGNOSIS — E7800 Pure hypercholesterolemia, unspecified: Secondary | ICD-10-CM | POA: Diagnosis not present

## 2021-02-24 DIAGNOSIS — E039 Hypothyroidism, unspecified: Secondary | ICD-10-CM | POA: Diagnosis not present

## 2021-02-24 DIAGNOSIS — Z1159 Encounter for screening for other viral diseases: Secondary | ICD-10-CM | POA: Diagnosis not present

## 2021-02-24 DIAGNOSIS — R0789 Other chest pain: Secondary | ICD-10-CM | POA: Diagnosis not present

## 2021-02-24 DIAGNOSIS — Z79899 Other long term (current) drug therapy: Secondary | ICD-10-CM | POA: Diagnosis not present

## 2021-03-01 ENCOUNTER — Other Ambulatory Visit: Payer: Self-pay | Admitting: Internal Medicine

## 2021-03-01 DIAGNOSIS — Z1231 Encounter for screening mammogram for malignant neoplasm of breast: Secondary | ICD-10-CM

## 2021-03-10 DIAGNOSIS — E559 Vitamin D deficiency, unspecified: Secondary | ICD-10-CM | POA: Diagnosis not present

## 2021-03-10 DIAGNOSIS — E7800 Pure hypercholesterolemia, unspecified: Secondary | ICD-10-CM | POA: Diagnosis not present

## 2021-03-10 DIAGNOSIS — E039 Hypothyroidism, unspecified: Secondary | ICD-10-CM | POA: Diagnosis not present

## 2021-03-10 DIAGNOSIS — I10 Essential (primary) hypertension: Secondary | ICD-10-CM | POA: Diagnosis not present

## 2021-03-10 DIAGNOSIS — R0789 Other chest pain: Secondary | ICD-10-CM | POA: Diagnosis not present

## 2021-04-12 DIAGNOSIS — Z91038 Other insect allergy status: Secondary | ICD-10-CM | POA: Diagnosis not present

## 2021-04-12 DIAGNOSIS — L299 Pruritus, unspecified: Secondary | ICD-10-CM | POA: Diagnosis not present

## 2021-04-12 DIAGNOSIS — J3089 Other allergic rhinitis: Secondary | ICD-10-CM | POA: Diagnosis not present

## 2021-04-12 DIAGNOSIS — L309 Dermatitis, unspecified: Secondary | ICD-10-CM | POA: Diagnosis not present

## 2021-04-26 DIAGNOSIS — M79604 Pain in right leg: Secondary | ICD-10-CM | POA: Diagnosis not present

## 2021-04-26 DIAGNOSIS — M79605 Pain in left leg: Secondary | ICD-10-CM | POA: Diagnosis not present

## 2021-04-26 DIAGNOSIS — I83811 Varicose veins of right lower extremities with pain: Secondary | ICD-10-CM | POA: Diagnosis not present

## 2021-05-17 DIAGNOSIS — M79652 Pain in left thigh: Secondary | ICD-10-CM | POA: Diagnosis not present

## 2021-05-17 DIAGNOSIS — M79605 Pain in left leg: Secondary | ICD-10-CM | POA: Diagnosis not present

## 2021-05-17 DIAGNOSIS — M79604 Pain in right leg: Secondary | ICD-10-CM | POA: Diagnosis not present

## 2021-05-17 DIAGNOSIS — M79651 Pain in right thigh: Secondary | ICD-10-CM | POA: Diagnosis not present

## 2021-06-13 DIAGNOSIS — L219 Seborrheic dermatitis, unspecified: Secondary | ICD-10-CM | POA: Diagnosis not present

## 2021-06-13 DIAGNOSIS — L659 Nonscarring hair loss, unspecified: Secondary | ICD-10-CM | POA: Diagnosis not present

## 2021-06-16 ENCOUNTER — Other Ambulatory Visit: Payer: Self-pay

## 2021-06-16 ENCOUNTER — Ambulatory Visit
Admission: RE | Admit: 2021-06-16 | Discharge: 2021-06-16 | Disposition: A | Discharge status: Home / Self Care | Payer: Medicare HMO | Source: Ambulatory Visit | Attending: Internal Medicine | Admitting: Internal Medicine

## 2021-06-16 DIAGNOSIS — Z1231 Encounter for screening mammogram for malignant neoplasm of breast: Secondary | ICD-10-CM

## 2021-06-24 DIAGNOSIS — M79605 Pain in left leg: Secondary | ICD-10-CM | POA: Diagnosis not present

## 2021-06-24 DIAGNOSIS — M79604 Pain in right leg: Secondary | ICD-10-CM | POA: Diagnosis not present

## 2021-06-27 DIAGNOSIS — I8311 Varicose veins of right lower extremity with inflammation: Secondary | ICD-10-CM | POA: Diagnosis not present

## 2021-07-04 DIAGNOSIS — I83811 Varicose veins of right lower extremities with pain: Secondary | ICD-10-CM | POA: Diagnosis not present

## 2021-07-04 DIAGNOSIS — I8311 Varicose veins of right lower extremity with inflammation: Secondary | ICD-10-CM | POA: Diagnosis not present

## 2021-07-13 DIAGNOSIS — M79604 Pain in right leg: Secondary | ICD-10-CM | POA: Diagnosis not present

## 2021-07-13 DIAGNOSIS — I8311 Varicose veins of right lower extremity with inflammation: Secondary | ICD-10-CM | POA: Diagnosis not present

## 2021-07-18 ENCOUNTER — Encounter: Payer: Self-pay | Admitting: Internal Medicine

## 2021-07-18 ENCOUNTER — Other Ambulatory Visit: Payer: Self-pay

## 2021-07-18 ENCOUNTER — Ambulatory Visit (INDEPENDENT_AMBULATORY_CARE_PROVIDER_SITE_OTHER): Payer: Medicare HMO | Admitting: Internal Medicine

## 2021-07-18 VITALS — BP 104/68 | HR 88 | Ht 62.0 in | Wt 121.8 lb

## 2021-07-18 DIAGNOSIS — N959 Unspecified menopausal and perimenopausal disorder: Secondary | ICD-10-CM

## 2021-07-18 DIAGNOSIS — E890 Postprocedural hypothyroidism: Secondary | ICD-10-CM

## 2021-07-18 DIAGNOSIS — L659 Nonscarring hair loss, unspecified: Secondary | ICD-10-CM

## 2021-07-18 LAB — BASIC METABOLIC PANEL
BUN: 20 mg/dL (ref 6–23)
CO2: 29 mEq/L (ref 19–32)
Calcium: 9.6 mg/dL (ref 8.4–10.5)
Chloride: 103 mEq/L (ref 96–112)
Creatinine, Ser: 0.73 mg/dL (ref 0.40–1.20)
GFR: 97.87 mL/min (ref >60.00)
Glucose, Bld: 82 mg/dL (ref 70–99)
Potassium: 3.9 mEq/L (ref 3.5–5.1)
Sodium: 140 mEq/L (ref 135–145)

## 2021-07-18 LAB — FOLLICLE STIMULATING HORMONE: FSH: 98.4 m[IU]/mL

## 2021-07-18 LAB — TSH: TSH: 9.28 u[IU]/mL — ABNORMAL HIGH (ref 0.35–5.50)

## 2021-07-18 NOTE — Progress Notes (Signed)
Name: Melinda Cannon  MRN/ DOB: [id number], Nov 05, 1976    Age/ Sex: 45 y.o., female    PCP: Caryl Bis, MD   Reason for Endocrinology Evaluation: Postoperative hypothyroidism     Date of Initial Endocrinology Evaluation: 07/18/2021     HPI: Melinda Cannon is a 45 y.o. female with a past medical history of hypothyroidism, dyslipidemia and HTN. The patient presented for initial endocrinology clinic visit on 07/18/2021 for consultative assistance with her postoperative hypothyroidism .   She was diagnosed with MNG in 2010 . She is S/P Left FNA with cytology report of abundant lymphoid tissue and rare benign appearing follicular epithelial cells.   She was on levothyroxine until a year ago and was switched to Synthroid    She is c/o hair loss 2019 , wears a wig now  Has hot flashes  started this yr. Has hx of hysterectomy  Heat intolerance to a near syncope followed by cold intolerance.  Weight loss  Has dry skin and fatigue   Saw two dermatologists , thought hair loss  to be related to hard water. Moved from that house and now in the [place]  She is also c/o facial wrinkles, premature aging and greying of hair   Denies hirsutism  Denies voice changes  Denies acne     Synthroid  88 mcg daily      HISTORY:  Past Medical History:  Past Medical History:  Diagnosis Date   Anxiety    Fibromyalgia    GERD (gastroesophageal reflux disease)    H/O calcium pyrophosphate deposition disease (CPPD)    causes migraines   H/O hiatal hernia    Headache(784.0)    Hypertension    Hypothyroidism    Kidney stones    Past Surgical History:  Past Surgical History:  Procedure Laterality Date   ABDOMINAL HYSTERECTOMY     CHOLECYSTECTOMY     LAPAROSCOPY Bilateral 06/05/2014   Procedure: REMOVAL OF LESION ON VAGINAL CUFF,  LEFT SALPINGO-OOPHORECTOMY, RIGHT SALPINGECTOMY.;  Surgeon: Linda Hedges, DO;  Location: [facility];  Service: Gynecology;  Laterality: Bilateral;   LITHOTRIPSY      TOTAL THYROIDECTOMY      Social History:  reports that she has been smoking cigarettes. She has a 12.50 pack-year smoking history. She has never used smokeless tobacco. She reports that she does not drink alcohol and does not use drugs. Family History: family history is not on file.   HOME MEDICATIONS: Allergies as of 07/18/2021       Reactions   Ciprofloxacin Rash, Other (See Comments)   Made her feel faint   Codeine Rash   Penicillins Rash        Medication List        Accurate as of July 18, 2021  9:38 AM. If you have any questions, ask your nurse or doctor.          STOP taking these medications    amitriptyline 50 MG tablet Commonly known as: ELAVIL Stopped by: Dorita Sciara, MD   lisinopril 5 MG tablet Commonly known as: ZESTRIL Stopped by: Dorita Sciara, MD   rizatriptan 10 MG disintegrating tablet Commonly known as: MAXALT-MLT Stopped by: Dorita Sciara, MD   topiramate 50 MG tablet Commonly known as: TOPAMAX Stopped by: Dorita Sciara, MD       TAKE these medications    ALPRAZolam 0.25 MG tablet Commonly known as: XANAX Take 0.25 mg by mouth 3 (three) times daily as  needed for anxiety.   dexlansoprazole 60 MG capsule Commonly known as: DEXILANT Take 60 mg by mouth daily.   diclofenac 75 MG EC tablet Commonly known as: VOLTAREN Take 75 mg by mouth 2 (two) times daily. Patient taking once daily   gabapentin 100 MG capsule Commonly known as: NEURONTIN Take 100 mg by mouth 2 (two) times daily. Patient taking once daily   HYDROcodone-acetaminophen 5-325 MG tablet Commonly known as: NORCO/VICODIN Take 1-2 tablets by mouth every 4 (four) hours as needed for moderate pain.   levothyroxine 50 MCG tablet Commonly known as: SYNTHROID Take 50 mcg by mouth daily before breakfast. Patient taking brand   norethindrone 0.35 MG tablet Commonly known as: MICRONOR Take 1 tablet by mouth daily.          REVIEW  OF SYSTEMS: A comprehensive ROS was conducted with the patient and is negative except as per HPI     OBJECTIVE:  VS: BP 104/68 (BP Location: Left Arm, Patient Position: Sitting, Cuff Size: Small)   Pulse 88   Ht '5\' 2"'$  (1.575 m)   Wt 121 lb 12.8 oz (55.2 kg)   SpO2 99%   BMI 22.28 kg/m    Wt Readings from Last 3 Encounters:  07/18/21 121 lb 12.8 oz (55.2 kg)  08/03/14 111 lb (50.3 kg)  06/05/14 109 lb (49.4 kg)     EXAM: General: Pt appears well and is in NAD  Neck: General: Supple without adenopathy. Thyroid: Thyroid size normal.  No goiter or nodules appreciated.  Lungs: Clear with good BS bilat with no rales, rhonchi, or wheezes  Heart: Auscultation: RRR.  Abdomen: Normoactive bowel sounds, soft, nontender, without masses or organomegaly palpable  Extremities:  BL LE: No pretibial edema normal ROM and strength.  Skin: Hair: Texture and amount normal with gender appropriate distribution. I don't see any wide hair lines nor thinning . Scalp not visible  Skin Inspection: No rashes Skin Palpation: Skin temperature, texture, and thickness normal to palpation  Neuro: Cranial nerves: II - XII grossly intact  Motor: Normal strength throughout DTRs: 2+ and symmetric in UE without delay in relaxation phase  Mental Status: Judgment, insight: Intact Memory: Intact for recent and remote events Mood and affect: No depression, anxiety, or agitation     DATA REVIEWED:  Results for GREIDY, LOGWOOD" (MRN [id number]) as of 07/19/2021 09:19  Ref. Range 07/18/2021 09:56  Sodium Latest Ref Range: 135 - 145 mEq/L 140  Potassium Latest Ref Range: 3.5 - 5.1 mEq/L 3.9  Chloride Latest Ref Range: 96 - 112 mEq/L 103  CO2 Latest Ref Range: 19 - 32 mEq/L 29  Glucose Latest Ref Range: 70 - 99 mg/dL 82  BUN Latest Ref Range: 6 - 23 mg/dL 20  Creatinine Latest Ref Range: 0.40 - 1.20 mg/dL 0.73  Calcium Latest Ref Range: 8.4 - 10.5 mg/dL 9.6  GFR Latest Ref Range: >60.00 mL/min 97.87   DHEA-SO4 Latest Ref Range: 15 - 205 mcg/dL 112  FSH Latest Units: mIU/ML 98.4  Estradiol Latest Units: pg/mL <15  TSH Latest Ref Range: 0.35 - 5.50 uIU/mL 9.28 (H)      ASSESSMENT/PLAN/RECOMMENDATIONS:   Postoperative Hypothyroidism :  - Pt with multiple non-specific symptoms  -- Pt educated extensively on the correct way to take levothyroxine (first thing in the morning with water, 30 minutes before eating or taking other medications). - Pt encouraged to double dose the following day if she were to miss a dose given long half-life of levothyroxine.  Medications : Stop Synthroid 88 mcg daily  Start Synthroid 100 mcg daily     2. Postmenopausal:  - FSH is elevated. Pt will be directed to Gyn for further discussion whether she wishes to start HRT    3. Hair loss :  - No evidence of alopecia on exam  -  HDEAS normal - Testosterone pending     Signed electronically by: Mack Guise, MD  [facility]  [facility] [address], [place], [place] [address] Phone: [phone] FAX: [phone]   CC: Caryl Bis, MD [place] [address] Phone: [phone] Fax: [phone]   Return to Endocrinology clinic as below: No future appointments.

## 2021-07-19 MED ORDER — SYNTHROID 100 MCG PO TABS: 100.0000 ug | ORAL_TABLET | Freq: Every day | ORAL | 1 refills | Status: DC

## 2021-07-21 LAB — ESTRADIOL: Estradiol: <15 pg/mL

## 2021-07-21 LAB — DHEA-SULFATE: DHEA-SO4: 112 ug/dL (ref 15–205)

## 2021-07-21 LAB — TESTOSTERONE, TOTAL, LC/MS/MS: Testosterone, Total, LC-MS-MS: 14 ng/dL (ref 2–45)

## 2021-07-29 DIAGNOSIS — M79651 Pain in right thigh: Secondary | ICD-10-CM | POA: Diagnosis not present

## 2021-07-29 DIAGNOSIS — M79604 Pain in right leg: Secondary | ICD-10-CM | POA: Diagnosis not present

## 2021-08-11 DIAGNOSIS — N959 Unspecified menopausal and perimenopausal disorder: Secondary | ICD-10-CM | POA: Diagnosis not present

## 2021-08-30 DIAGNOSIS — M79604 Pain in right leg: Secondary | ICD-10-CM | POA: Diagnosis not present

## 2021-08-30 DIAGNOSIS — M79651 Pain in right thigh: Secondary | ICD-10-CM | POA: Diagnosis not present

## 2021-09-03 DIAGNOSIS — R69 Illness, unspecified: Secondary | ICD-10-CM | POA: Diagnosis not present

## 2021-09-03 DIAGNOSIS — E7849 Other hyperlipidemia: Secondary | ICD-10-CM | POA: Diagnosis not present

## 2021-09-03 DIAGNOSIS — K219 Gastro-esophageal reflux disease without esophagitis: Secondary | ICD-10-CM | POA: Diagnosis not present

## 2021-09-03 DIAGNOSIS — I10 Essential (primary) hypertension: Secondary | ICD-10-CM | POA: Diagnosis not present

## 2021-09-03 DIAGNOSIS — M797 Fibromyalgia: Secondary | ICD-10-CM | POA: Diagnosis not present

## 2021-09-03 DIAGNOSIS — E039 Hypothyroidism, unspecified: Secondary | ICD-10-CM | POA: Diagnosis not present

## 2021-09-03 DIAGNOSIS — K589 Irritable bowel syndrome without diarrhea: Secondary | ICD-10-CM | POA: Diagnosis not present

## 2021-10-07 DIAGNOSIS — R69 Illness, unspecified: Secondary | ICD-10-CM | POA: Diagnosis not present

## 2021-10-07 DIAGNOSIS — R0981 Nasal congestion: Secondary | ICD-10-CM | POA: Diagnosis not present

## 2021-10-07 DIAGNOSIS — G43909 Migraine, unspecified, not intractable, without status migrainosus: Secondary | ICD-10-CM | POA: Diagnosis not present

## 2021-10-07 DIAGNOSIS — E039 Hypothyroidism, unspecified: Secondary | ICD-10-CM | POA: Diagnosis not present

## 2021-10-07 DIAGNOSIS — K449 Diaphragmatic hernia without obstruction or gangrene: Secondary | ICD-10-CM | POA: Diagnosis not present

## 2021-10-07 DIAGNOSIS — I10 Essential (primary) hypertension: Secondary | ICD-10-CM | POA: Diagnosis not present

## 2021-10-19 ENCOUNTER — Encounter: Payer: Self-pay | Admitting: Internal Medicine

## 2021-10-19 ENCOUNTER — Other Ambulatory Visit: Payer: Self-pay

## 2021-10-19 ENCOUNTER — Telehealth: Payer: Self-pay | Admitting: Internal Medicine

## 2021-10-19 ENCOUNTER — Ambulatory Visit: Payer: Medicare HMO | Admitting: Internal Medicine

## 2021-10-19 VITALS — BP 140/100 | HR 85 | Ht 62.0 in | Wt 122.2 lb

## 2021-10-19 DIAGNOSIS — R42 Dizziness and giddiness: Secondary | ICD-10-CM

## 2021-10-19 DIAGNOSIS — E890 Postprocedural hypothyroidism: Secondary | ICD-10-CM

## 2021-10-19 DIAGNOSIS — L659 Nonscarring hair loss, unspecified: Secondary | ICD-10-CM | POA: Diagnosis not present

## 2021-10-19 DIAGNOSIS — N959 Unspecified menopausal and perimenopausal disorder: Secondary | ICD-10-CM | POA: Diagnosis not present

## 2021-10-19 LAB — TSH: TSH: 0.32 u[IU]/mL — ABNORMAL LOW (ref 0.35–5.50)

## 2021-10-19 NOTE — Telephone Encounter (Signed)
Please let the pt know that she is on too much Synthroid.    Change as follows   Synthroid 100 mcg   Take HALF on Sundays only from now on,  but take 1 tablet the rest of the week    Needs labs in 2 months

## 2021-10-19 NOTE — Telephone Encounter (Signed)
Patient notified and will adjust medication as advised

## 2021-10-19 NOTE — Progress Notes (Signed)
Name: Melinda Cannon  MRN/ DOB: [id number], Mar 04, 1976    Age/ Sex: 45 y.o., female     PCP: Caryl Bis, MD   Reason for Endocrinology Evaluation: Postablative hypothyroidism     Initial Endocrinology Clinic Visit: 07/18/2021    PATIENT IDENTIFIER: Melinda Cannon is a 45 y.o., female with a past medical history of  hypothyroidism, dyslipidemia and HTN . She has followed with [facility] since 07/18/2021 for consultative assistance with management of her Hypothyroidism.   HISTORICAL SUMMARY:   She was diagnosed with MNG in 2010 . She is S/P Left FNA with cytology report of abundant lymphoid tissue and rare benign appearing follicular epithelial cells.  She is S/P thyroidectomy ~ 16 yrs ago . She is not sure if the whole thyroid taken out or partial .    Continues with dry skin  Has occasional palpitations  Denies tremors.     She was on levothyroxine until 2021 and was switched to Synthroid  She has noted hair loss 2019 , she had to resort to wearing a wig    Postmenopause History : She has been having hot flashes in 2022. [place] elevated at 98.4 mIU/mL . Saw Gyn and was started on Estrogen patch , she noted lightheadedness and rash so she stopped it with f/u with Gyn.   At the time she was c/o heat intolerance to a near syncope followed by cold intolerance  Has hx of hysterectomy   Saw two dermatologists , thought hair loss  to be related to hard water. Moved from that house and now living  in the [place] but did not help.   She was also  c/o facial wrinkles, premature aging and greying of hair   SUBJECTIVE:    Today (10/19/2021):  Melinda Cannon is here for postablative hypothyroidism.   Since her last visit we increased levothyroxine.  She was Gyn and was started on Estrogen patch but this caused local rash and was advised to stop by them. She has a follow up.   Weight stable  She has not noted improvement in hair loss  Denies diarrhea, but had  occasional estradiol  Denies local neck swelling    She also endorses lightheadedness that coincides with starting estradiol patch. She was noted to have elevated BP at PCPs office CBP 150 mmhg. She took Lisinopril 10 mg and she self reduced it to 5 mg , when hypotension was noted, she was advised by PCP to stop it and to check BP at home for 2 weeks.    Levothyroxine 100 mcg daily     HISTORY:  Past Medical History:  Past Medical History:  Diagnosis Date   Anxiety    Fibromyalgia    GERD (gastroesophageal reflux disease)    H/O calcium pyrophosphate deposition disease (CPPD)    causes migraines   H/O hiatal hernia    Headache(784.0)    Hypertension    Hypothyroidism    Kidney stones    Past Surgical History:  Past Surgical History:  Procedure Laterality Date   ABDOMINAL HYSTERECTOMY     CHOLECYSTECTOMY     LAPAROSCOPY Bilateral 06/05/2014   Procedure: REMOVAL OF LESION ON VAGINAL CUFF,  LEFT SALPINGO-OOPHORECTOMY, RIGHT SALPINGECTOMY.;  Surgeon: Linda Hedges, DO;  Location: [facility];  Service: Gynecology;  Laterality: Bilateral;   LITHOTRIPSY     TOTAL THYROIDECTOMY     Social History:  reports that she has been smoking cigarettes. She has a 12.50 pack-year smoking history.  She has never used smokeless tobacco. She reports that she does not drink alcohol and does not use drugs. Family History: No family history on file.   HOME MEDICATIONS: Allergies as of 10/19/2021       Reactions   Ciprofloxacin Rash, Other (See Comments)   Made her feel faint   Codeine Rash   Penicillins Rash        Medication List        Accurate as of October 19, 2021 11:15 AM. If you have any questions, ask your nurse or doctor.          STOP taking these medications    diclofenac 75 MG EC tablet Commonly known as: VOLTAREN Stopped by: Dorita Sciara, MD   gabapentin 100 MG capsule Commonly known as: NEURONTIN Stopped by: Dorita Sciara, MD    HYDROcodone-acetaminophen 5-325 MG tablet Commonly known as: NORCO/VICODIN Stopped by: Dorita Sciara, MD   norethindrone 0.35 MG tablet Commonly known as: MICRONOR Stopped by: Dorita Sciara, MD       TAKE these medications    ALPRAZolam 0.25 MG tablet Commonly known as: XANAX Take 0.25 mg by mouth 3 (three) times daily as needed for anxiety.   dexlansoprazole 60 MG capsule Commonly known as: DEXILANT Take 60 mg by mouth daily.   Synthroid 100 MCG tablet Generic drug: levothyroxine Take 1 tablet (100 mcg total) by mouth daily before breakfast.          OBJECTIVE:   PHYSICAL EXAM: VS: BP 130/84 (BP Location: Right Arm, Patient Position: Sitting, Cuff Size: Normal)    Pulse 85    Ht 5\' 2"  (1.575 m)    Wt 122 lb 3.2 oz (55.4 kg)    SpO2 99%    BMI 22.35 kg/m    EXAM: General: Pt appears well and is in NAD  Neck: General: Supple without adenopathy. Thyroid: No goiter or nodules appreciated.   Lungs: Clear with good BS bilat with no rales, rhonchi, or wheezes  Heart: Auscultation: RRR.  Abdomen: Normoactive bowel sounds, soft, nontender, without masses or organomegaly palpable  Extremities:  BL LE: No pretibial edema normal ROM and strength.  Mental Status: Judgment, insight: Intact Orientation: Oriented to time, place, and person Mood and affect: No depression, anxiety, or agitation     DATA REVIEWED:   Latest Reference Range & Units 07/18/21 09:56 10/19/21 10:38  TSH 0.35 - 5.50 uIU/mL 9.28 (H) 0.32 (L)     ASSESSMENT / PLAN / RECOMMENDATIONS:    Postoperative Hypothyroidism :   - Pt with multiple non-specific symptoms  -Her TSH went from 9.28 uIU/mL to 0.32 uIU/mL by increasing levothyroxine from 88 to 100 mcg ?Marland Kitchen I suspect variable absorption or imperfect adherence.  -- Pt educated extensively on the correct way to take levothyroxine (first thing in the morning with water, 30 minutes before eating or taking other medications). - Pt  encouraged to double dose the following day if she were to miss a dose given long half-life of levothyroxine.     Medications :   Take Synthroid 100 mcg, Half a tablet on Sundays and 1 tablet the rest of the week       2. Postmenopausal:   - FSH is elevated. Follows with Gyn for HRT replacement. Developed a rash to estrogen patch      3. Hair loss :   -  DHEAS normal - Testosterone normal at 14 ng/dL   4. Light headedness :  - BP elevated  today, no ortho stasis. Pt to follow up with PCP    I spent 25 minutes preparing to see the patient by review of recent labs, imaging and procedures, obtaining and reviewing separately obtained history, communicating with the patient, ordering medications, tests or procedures, and documenting clinical information in the EHR including the differential Dx, treatment, and any further evaluation and other management   Signed electronically by: Melinda Guise, MD  [facility]  [facility] [address] [place], [place] [address] Phone: [phone] FAX: [phone]      CC: Caryl Bis, MD [place] [address] Phone: [phone]  Fax: [phone]   Return to Endocrinology clinic as below: Future Appointments  Date Time Provider [place]  02/17/2022  8:50 AM Ahmira Boisselle, Melanie Crazier, MD [id number] None

## 2021-10-19 NOTE — Telephone Encounter (Signed)
Patient called back wanting further clarification on the instructions moving forward with Synthroid. I explained that she will be taking Synthroid 100 mcg with half on Sundays from now on with 1 tablet the rest of the week but she would like to know how half a tablet is going to change anything since this is her first time having it prescribed that way. Patient says that she would also like more clarification on how she is on too much Synthroid if her results on Mychart are showing 0.32 with a range of 0.35-5.50.

## 2021-10-20 MED ORDER — SYNTHROID 100 MCG PO TABS: 100.0000 ug | ORAL_TABLET | ORAL | 1 refills | Status: DC

## 2021-10-20 NOTE — Telephone Encounter (Signed)
Spoke to the pt 10/20/2021 at 9 Am and I have advised her as previous notes to take half a tablet of Synthroid on Sundays and 1 tablet Monday through Saturday

## 2021-10-20 NOTE — Addendum Note (Signed)
Addended by: Dorita Sciara on: 10/20/2021 08:56 AM   Modules accepted: Orders

## 2021-11-16 DIAGNOSIS — J019 Acute sinusitis, unspecified: Secondary | ICD-10-CM | POA: Diagnosis not present

## 2021-11-16 DIAGNOSIS — Z01419 Encounter for gynecological examination (general) (routine) without abnormal findings: Secondary | ICD-10-CM | POA: Diagnosis not present

## 2021-11-28 DIAGNOSIS — I87391 Chronic venous hypertension (idiopathic) with other complications of right lower extremity: Secondary | ICD-10-CM | POA: Diagnosis not present

## 2021-11-28 DIAGNOSIS — I872 Venous insufficiency (chronic) (peripheral): Secondary | ICD-10-CM | POA: Diagnosis not present

## 2021-11-28 DIAGNOSIS — M7989 Other specified soft tissue disorders: Secondary | ICD-10-CM | POA: Diagnosis not present

## 2021-12-21 ENCOUNTER — Other Ambulatory Visit: Payer: Self-pay | Admitting: Internal Medicine

## 2021-12-21 ENCOUNTER — Other Ambulatory Visit: Payer: Self-pay

## 2021-12-21 ENCOUNTER — Other Ambulatory Visit (INDEPENDENT_AMBULATORY_CARE_PROVIDER_SITE_OTHER): Payer: Medicare HMO

## 2021-12-21 DIAGNOSIS — E890 Postprocedural hypothyroidism: Secondary | ICD-10-CM

## 2021-12-21 LAB — T4, FREE: Free T4: 1.53 ng/dL (ref 0.60–1.60)

## 2021-12-21 LAB — TSH: TSH: 0.26 u[IU]/mL — ABNORMAL LOW (ref 0.35–5.50)

## 2021-12-21 MED ORDER — LEVOTHYROXINE SODIUM 88 MCG PO TABS: 88.0000 ug | ORAL_TABLET | Freq: Every day | ORAL | 4 refills | Status: DC

## 2021-12-22 ENCOUNTER — Telehealth: Payer: Self-pay | Admitting: Internal Medicine

## 2021-12-22 NOTE — Telephone Encounter (Signed)
Patient called to advise that her pharmacy of choice is now the [facility] [address] in [place].  She would like her RX from 12/21/21 to be sent to the new pharmacy.  She also wants to confirm that the RX is Synthroid and not levothyroxine.  Call back # [phone]

## 2021-12-23 ENCOUNTER — Other Ambulatory Visit: Payer: Self-pay

## 2021-12-23 MED ORDER — LEVOTHYROXINE SODIUM 88 MCG PO TABS: 88.0000 ug | ORAL_TABLET | Freq: Every day | ORAL | 4 refills | Status: DC

## 2021-12-23 NOTE — Telephone Encounter (Signed)
Medication was sent to [facility] on [address].

## 2021-12-23 NOTE — Telephone Encounter (Signed)
Done

## 2022-01-05 DIAGNOSIS — H524 Presbyopia: Secondary | ICD-10-CM | POA: Diagnosis not present

## 2022-01-05 DIAGNOSIS — Z0100 Encounter for examination of eyes and vision without abnormal findings: Secondary | ICD-10-CM | POA: Diagnosis not present

## 2022-01-21 DIAGNOSIS — I10 Essential (primary) hypertension: Secondary | ICD-10-CM | POA: Diagnosis not present

## 2022-01-21 DIAGNOSIS — Z0001 Encounter for general adult medical examination with abnormal findings: Secondary | ICD-10-CM | POA: Diagnosis not present

## 2022-01-21 DIAGNOSIS — E7849 Other hyperlipidemia: Secondary | ICD-10-CM | POA: Diagnosis not present

## 2022-01-21 DIAGNOSIS — R69 Illness, unspecified: Secondary | ICD-10-CM | POA: Diagnosis not present

## 2022-01-21 DIAGNOSIS — E039 Hypothyroidism, unspecified: Secondary | ICD-10-CM | POA: Diagnosis not present

## 2022-01-21 DIAGNOSIS — M797 Fibromyalgia: Secondary | ICD-10-CM | POA: Diagnosis not present

## 2022-01-21 DIAGNOSIS — K589 Irritable bowel syndrome without diarrhea: Secondary | ICD-10-CM | POA: Diagnosis not present

## 2022-02-15 ENCOUNTER — Telehealth (HOSPITAL_COMMUNITY): Payer: Self-pay | Admitting: Vascular Surgery

## 2022-02-15 NOTE — Telephone Encounter (Signed)
Pt ios not appropriate for ah[facility] clinic, referring office has been notified spoke to  [employer]) in referrals ?

## 2022-02-17 ENCOUNTER — Encounter: Payer: Self-pay | Admitting: Internal Medicine

## 2022-02-17 ENCOUNTER — Ambulatory Visit: Payer: Medicare HMO | Admitting: Internal Medicine

## 2022-02-17 VITALS — BP 120/76 | HR 84 | Ht 62.0 in | Wt 124.6 lb

## 2022-02-17 DIAGNOSIS — R5383 Other fatigue: Secondary | ICD-10-CM

## 2022-02-17 DIAGNOSIS — N959 Unspecified menopausal and perimenopausal disorder: Secondary | ICD-10-CM

## 2022-02-17 DIAGNOSIS — E890 Postprocedural hypothyroidism: Secondary | ICD-10-CM

## 2022-02-17 LAB — TSH: TSH: 0.5 u[IU]/mL (ref 0.35–5.50)

## 2022-02-17 LAB — VITAMIN B12: Vitamin B-12: 897 pg/mL (ref 211–911)

## 2022-02-17 LAB — T4, FREE: Free T4: 1.24 ng/dL (ref 0.60–1.60)

## 2022-02-17 LAB — VITAMIN D 25 HYDROXY (VIT D DEFICIENCY, FRACTURES): VITD: 29.32 ng/mL — ABNORMAL LOW (ref 30.00–100.00)

## 2022-02-17 MED ORDER — LEVOTHYROXINE SODIUM 88 MCG PO TABS: 88.0000 ug | ORAL_TABLET | Freq: Every day | ORAL | 3 refills | Status: DC

## 2022-02-17 NOTE — Progress Notes (Signed)
? ?Name: Melinda Cannon  ?MRN/ DOB: [id number], 1975/12/18    ?Age/ Sex: 46 y.o., female   ? ? ?PCP: Caryl Bis, MD   ?Reason for Endocrinology Evaluation: Postablative hypothyroidism  ?   ?Initial Endocrinology Clinic Visit: 07/18/2021  ? ? ?PATIENT IDENTIFIER: Melinda Cannon is a 46 y.o., female with a past medical history of  hypothyroidism, dyslipidemia and HTN . She has followed with [facility] since 07/18/2021 for consultative assistance with management of her Hypothyroidism.  ? ?HISTORICAL SUMMARY:  ? ?She was diagnosed with MNG in 2010 . She is S/P Left FNA with cytology report of abundant lymphoid tissue and rare benign appearing follicular epithelial cells.  ?She is S/P thyroidectomy ~ 16 yrs ago . She is not sure if the whole thyroid taken out or partial .  ?  ?Continues with dry skin  ?Has occasional palpitations  ?Denies tremors.  ? ? ? ?She was on levothyroxine until 2021 and was switched to Synthroid  ?She has noted hair loss 2019 , she had to resort to wearing a wig ? ? ? ?Postmenopause History : ?She has been having hot flashes in 2022. [place] elevated at 98.4 mIU/mL . Saw Gyn and was started on Estrogen patch , she noted lightheadedness and rash so she stopped it with f/u with Gyn.  ? ?At the time she was c/o heat intolerance to a near syncope followed by cold intolerance  ?Has hx of hysterectomy  ? ?Saw two dermatologists , thought hair loss  to be related to hard water. Moved from that house and now living  in the [place] but did not help.  ? ?She was also  c/o facial wrinkles, premature aging and greying of hair  ? ? ? ?SUBJECTIVE:  ? ? ?Today (02/17/2022):  Melinda Cannon is here for postablative hypothyroidism.  ? ? ? ?Weight stable over the past 6 months  ?She continues with hair loss  ?She has fatigue and would like B12 is checked  ?Sleeping is variable  ?Saw Gyn and was started on MVI , she does not want to be on Estradiol due to risk of cancer  ? ? ? ?Levothyroxine 52mg  , daily  ? ? ? ?HISTORY:  ?Past Medical History:  ?Past Medical History:  ?Diagnosis Date  ? Anxiety   ? Fibromyalgia   ? GERD (gastroesophageal reflux disease)   ? H/O calcium pyrophosphate deposition disease (CPPD)   ? causes migraines  ? H/O hiatal hernia   ? Headache(784.0)   ? Hypertension   ? Hypothyroidism   ? Kidney stones   ? ?Past Surgical History:  ?Past Surgical History:  ?Procedure Laterality Date  ? ABDOMINAL HYSTERECTOMY    ? CHOLECYSTECTOMY    ? LAPAROSCOPY Bilateral 06/05/2014  ? Procedure: REMOVAL OF LESION ON VAGINAL CUFF,  LEFT SALPINGO-OOPHORECTOMY, RIGHT SALPINGECTOMY.;  Surgeon: MLinda Hedges DO;  [facility];  Service: Gynecology;  Laterality: Bilateral;  ? LITHOTRIPSY    ? TOTAL THYROIDECTOMY    ? ?Social History:  reports that she has been smoking cigarettes. She has a 12.50 pack-year smoking history. She has never used smokeless tobacco. She reports that she does not drink alcohol and does not use drugs. ?Family History: No family history on file. ? ? ?HOME MEDICATIONS: ?Allergies as of 02/17/2022   ? ?   Reactions  ? Ciprofloxacin Rash, Other (See Comments)  ? Made her feel faint  ? Codeine Rash  ? Penicillins Rash  ? ?  ? ?  ?  Medication List  ?  ? ?  ? Accurate as of February 17, 2022 11:06 AM. If you have any questions, ask your nurse or doctor.  ?  ?  ? ?  ? ?ALPRAZolam 0.25 MG tablet ?Commonly known as: Duanne Moron ?Take 0.25 mg by mouth 3 (three) times daily as needed for anxiety. ?  ?dexlansoprazole 60 MG capsule ?Commonly known as: DEXILANT ?Take 60 mg by mouth daily. ?  ?levothyroxine 88 MCG tablet ?Commonly known as: Synthroid ?Take 1 tablet (88 mcg total) by mouth daily before breakfast. ?  ?MULTI-VITAMIN MENOPAUSAL PO ?Take by mouth. ?  ? ?  ? ? ? ? ?OBJECTIVE:  ? ?PHYSICAL EXAM: ?VS: BP 120/76 (BP Location: Left Arm, Patient Position: Sitting, Cuff Size: Normal)   Pulse 84   Ht '5\' 2"'$  (1.575 m)   Wt 124 lb 9.6 oz (56.5 kg)   SpO2 96%   BMI 22.79 kg/m?   ? ?EXAM: ?General: Pt  appears well and is in NAD  ?Neck: General: Supple without adenopathy. ?Thyroid: No goiter or nodules appreciated.   ?Lungs: Clear with good BS bilat with no rales, rhonchi, or wheezes  ?Heart: Auscultation: RRR.  ?Abdomen: Normoactive bowel sounds, soft, nontender, without masses or organomegaly palpable  ?Extremities:  ?BL LE: No pretibial edema normal ROM and strength.  ?Mental Status: Judgment, insight: Intact ?Orientation: Oriented to time, place, and person ?Mood and affect: No depression, anxiety, or agitation  ? ? ? ?DATA REVIEWED: ? Latest Reference Range & Units 02/17/22 09:18  ?TSH 0.35 - 5.50 uIU/mL 0.50  ?T4,Free(Direct) 0.60 - 1.60 ng/dL 1.24  ? ? Latest Reference Range & Units 02/17/22 09:18  ?VITD 30.00 - 100.00 ng/mL 29.32 (L)  ?Vitamin B12 211 - 911 pg/mL 897  ? ? ?ASSESSMENT / PLAN / RECOMMENDATIONS:  ? ? ?Postoperative Hypothyroidism : ?  ?-Patient is clinically thyroid ?-No local neck symptoms ?-TSH have normalized, no changes ?  ?Medications : ?  ?Synthroid 88 mcg, daily ?  ? ?2. Postmenopausal: ?  ?- FSH is elevated. Follows with Gyn.  Developed a rash and lightheadedness to estrogen.  Patient would like to avoid HRT due to increased risk of breast cancer ?-We discussed alternative therapy with paroxetine but the patient would like to avoid any chance of side effects and will work on lifestyle changes ?  ?  ?3.  Fatigue: ? ? ?-Per patient request vitamin B12 and vitamin D have been ordered ?-Vitamin B12 is normal but vitamin D is slightly low ? ?Medication ?Start vitamin D3 1000 IU daily ?Continue MVI daily ? ? ?Follow-up 6 months ?Labs in 3 months ? ?Signed electronically by: ?Abby Nena Jordan, MD ? ?[facility]  ?[facility] ?[address] ?[place], [place] [address] ?Phone: [phone] ?FAX: [phone]  ? ? ? ? ?CC: ?Caryl Bis, MD ?[address] ?[place] [address] ?Phone: [phone]  ?Fax: [phone] ? ? ?Return to Endocrinology clinic as  below: ?Future Appointments  ?Date Time Provider [place]  ?08/23/2022  8:10 AM Tuere Nwosu, Melanie Crazier, MD [facility] None  ?09/12/2022  9:00 AM Lavonna Monarch, MD [facility]  ?  ? ?

## 2022-02-27 DIAGNOSIS — I872 Venous insufficiency (chronic) (peripheral): Secondary | ICD-10-CM | POA: Diagnosis not present

## 2022-02-27 DIAGNOSIS — M79604 Pain in right leg: Secondary | ICD-10-CM | POA: Diagnosis not present

## 2022-02-27 DIAGNOSIS — M79661 Pain in right lower leg: Secondary | ICD-10-CM | POA: Diagnosis not present

## 2022-03-06 ENCOUNTER — Other Ambulatory Visit: Payer: Self-pay | Admitting: Internal Medicine

## 2022-03-06 ENCOUNTER — Other Ambulatory Visit: Payer: Self-pay | Admitting: Obstetrics & Gynecology

## 2022-03-06 DIAGNOSIS — Z1231 Encounter for screening mammogram for malignant neoplasm of breast: Secondary | ICD-10-CM

## 2022-03-14 ENCOUNTER — Encounter: Payer: Self-pay | Admitting: Internal Medicine

## 2022-03-14 ENCOUNTER — Ambulatory Visit: Payer: Medicare HMO | Admitting: Internal Medicine

## 2022-03-14 VITALS — BP 110/70 | HR 74 | Ht 62.0 in | Wt 125.8 lb

## 2022-03-14 DIAGNOSIS — E039 Hypothyroidism, unspecified: Secondary | ICD-10-CM

## 2022-03-14 DIAGNOSIS — R079 Chest pain, unspecified: Secondary | ICD-10-CM | POA: Diagnosis not present

## 2022-03-14 DIAGNOSIS — Z79899 Other long term (current) drug therapy: Secondary | ICD-10-CM | POA: Diagnosis not present

## 2022-03-14 DIAGNOSIS — R5383 Other fatigue: Secondary | ICD-10-CM | POA: Diagnosis not present

## 2022-03-14 NOTE — Progress Notes (Signed)
? ?Cardiology Office Note ? ? ?Date:  03/14/2022  ? ?ID:  Melinda Cannon, DOB [birth year], MRN [id number] ? ?PCP:  Caryl Bis, MD  ?Cardiologist:   Dorris Carnes, MD  ? ?Pt referred for chest pain    ? ?  ?History of Present Illness: ?Melinda Cannon is a 46 y.o. female with a history of hypothyroidism, HL, HTN   Followed by [facility] since 2022   Also followed by [facility]  ? ?Patient says she has L sided CP, under  her L breast, over ribs     ?(Patient says she also had a knot in L breast that was followed   This is higher, more lateral.) ?Pt says she nothing makes  it worse or better   IT is sporadic   can last a couple hours then eases on own   Describes it as a pressure.   Not associated with activity   Can last all day   not pleuritic or positional ?Has had some SOB for years but has blamed it on nasal problems ?Pt does some walking, not much   Works in yard.       ? ? ?Former husband past away   ? If she has broken heart syndrome  ?    ?Diet   ?Breakfast   Oatmeal   Bran flakes   Muffin ?Lunch   Pimento cheese sandwich   Ham ?Dinner   Veggies when can   Ham  Chicken ?Water   Lemonade   Chocalate milk ? ?Current Meds  ?Medication Sig  ? ALPRAZolam (XANAX) 0.25 MG tablet Take 0.25 mg by mouth 3 (three) times daily as needed for anxiety.  ? dexlansoprazole (DEXILANT) 60 MG capsule Take 60 mg by mouth daily.  ? levothyroxine (SYNTHROID) 88 MCG tablet Take 1 tablet (88 mcg total) by mouth daily before breakfast.  ? Multiple Vitamins-Minerals (MULTI-VITAMIN MENOPAUSAL PO) Take by mouth.  ? ? ? ?Allergies:   Ciprofloxacin, Codeine, and Penicillins  ? ?Past Medical History:  ?Diagnosis Date  ? Anxiety   ? Fibromyalgia   ? GERD (gastroesophageal reflux disease)   ? H/O calcium pyrophosphate deposition disease (CPPD)   ? causes migraines  ? H/O hiatal hernia   ? Headache(784.0)   ? Hypertension   ? Hypothyroidism   ? Kidney stones   ? ? ?Past Surgical History:  ?Procedure Laterality Date  ? ABDOMINAL HYSTERECTOMY     ? CHOLECYSTECTOMY    ? LAPAROSCOPY Bilateral 06/05/2014  ? Procedure: REMOVAL OF LESION ON VAGINAL CUFF,  LEFT SALPINGO-OOPHORECTOMY, RIGHT SALPINGECTOMY.;  Surgeon: Linda Hedges, DO;  Location: [facility];  Service: Gynecology;  Laterality: Bilateral;  ? LITHOTRIPSY    ? TOTAL THYROIDECTOMY    ? ? ? ?Social History:  The patient  reports that she has quit smoking. Her smoking use included cigarettes. She has a 12.50 pack-year smoking history. She has never used smokeless tobacco. She reports that she does not drink alcohol and does not use drugs.  ? ?Family History:  The patient's family history is not on file.  ? ? ?ROS:  Please see the history of present illness. All other systems are reviewed and  Negative to the above problem except as noted.  ? ? ?PHYSICAL EXAM: ?VS:  BP 110/70   Pulse 74   Ht '5\' 2"'$  (1.575 m)   Wt 125 lb 12.8 oz (57.1 kg)   SpO2 98%   BMI 23.01 kg/m?   ?GEN: Well nourished, well developed,  in no acute distress  ?HEENT: normal  ?Neck: no JVD, carotid bruits,  ?Cardiac: RRR; no murmurs, rubs, or gallops,no edema  ?Chest:  Mld tenderness in L parasternal area ?Respiratory:  clear to auscultation bilaterally, normal work of breathing ?GI: soft, nontender, nondistended, + BS  No hepatomegaly  ?MS: no deformity Moving all extremities   ?Skin: warm and dry, no rash ?Neuro:  Strength and sensation are intact ?Psych: euthymic mood, full affect ? ? ?EKG:  EKG is ordered today.  NSR   74   ? ? ?Lipid Panel ?No results found for: CHOL, TRIG, HDL, CHOLHDL, VLDL, LDLCALC, LDLDIRECT ?  ? ?Wt Readings from Last 3 Encounters:  ?03/14/22 125 lb 12.8 oz (57.1 kg)  ?02/17/22 124 lb 9.6 oz (56.5 kg)  ?10/19/21 122 lb 3.2 oz (55.4 kg)  ?  ? ? ?ASSESSMENT AND PLAN: ? ?1  Chest pain  Atypical It appears musculoskeletal   Can touch region and elicit somce  She is concerned about pressures with talking   Will set up for an echo to eval systolic/diastolic function  ?If she really wanted to look for plaquing could  consider a Ca score CT   Pt will reflect on this ? ?2  Lipids   LDL 166  HDL 45  Trig 93  Will check lipomed, Lpa and Apo B   ? ?3 Thyroid   Follows in [facility] for thyroid   Will check A1C ? ?4  Diet   Given resources for diet  (Zoe, Sugar video--   loves ? ? ?Current medicines are reviewed at length with the patient today.  The patient does not have concerns regarding medicines. ? ?Signed, ?Dorris Carnes, MD  ?03/14/2022 9:10 AM    ?[facility] ?[place], [place], [place]  [address] ?Phone: [phone]; Fax: [phone]  ? ? ?

## 2022-03-14 NOTE — Patient Instructions (Signed)
Medication Instructions:  ? ?*If you need a refill on your cardiac medications before your next appointment, please call your pharmacy* ? ? ?Lab Work: ?CBC, HGBA1C, LIPOMED PANEL  ? ?If you have labs (blood work) drawn today and your tests are completely normal, you will receive your results only by: ?MyChart Message (if you have MyChart) OR ?A paper copy in the mail ?If you have any lab test that is abnormal or we need to change your treatment, we will call you to review the results. ? ? ?Testing/Procedures: ?Your physician has requested that you have an echocardiogram. Echocardiography is a painless test that uses sound waves to create images of your heart. It provides your doctor with information about the size and shape of your heart and how well your heart?s chambers and valves are working. This procedure takes approximately one hour. There are no restrictions for this procedure. ? ? ? ?Follow-Up: ?At [facility], you and your health needs are our priority.  As part of our continuing mission to provide you with exceptional heart care, we have created designated Provider Care Teams.  These Care Teams include your primary Cardiologist (physician) and Advanced Practice Providers (APPs -  Physician Assistants and Nurse Practitioners) who all work together to provide you with the care you need, when you need it. ? ?We recommend signing up for the patient portal called "MyChart".  Sign up information is provided on this After Visit Summary.  MyChart is used to connect with patients for Virtual Visits (Telemedicine).  Patients are able to view lab/test results, encounter notes, upcoming appointments, etc.  Non-urgent messages can be sent to your provider as well.   ?To learn more about what you can do with MyChart, go to [email or web address].   ? ? ?Important Information About Sugar ? ? ? ? ?  ?

## 2022-03-16 ENCOUNTER — Encounter: Payer: Self-pay | Admitting: Internal Medicine

## 2022-03-16 LAB — NMR, LIPOPROFILE
Cholesterol, Total: 240 mg/dL — ABNORMAL HIGH (ref 100–199)
HDL Particle Number: 32.1 umol/L (ref >=30.5)
HDL-C: 54 mg/dL (ref >39)
LDL Particle Number: 1559 nmol/L — ABNORMAL HIGH (ref <1000)
LDL Size: 22.2 nm (ref >20.5)
LDL-C (NIH Calc): 158 mg/dL — ABNORMAL HIGH (ref 0–99)
LP-IR Score: <25 (ref <=45)
Small LDL Particle Number: 208 nmol/L (ref <=527)
Triglycerides: 153 mg/dL — ABNORMAL HIGH (ref 0–149)

## 2022-03-16 LAB — CBC
Hematocrit: 41.1 % (ref 34.0–46.6)
Hemoglobin: 13.7 g/dL (ref 11.1–15.9)
MCH: 31.1 pg (ref 26.6–33.0)
MCHC: 33.3 g/dL (ref 31.5–35.7)
MCV: 93 fL (ref 79–97)
Platelets: 209 10*3/uL (ref 150–450)
RBC: 4.41 x10E6/uL (ref 3.77–5.28)
RDW: 11.5 % — ABNORMAL LOW (ref 11.7–15.4)
WBC: 7.2 10*3/uL (ref 3.4–10.8)

## 2022-03-16 LAB — APOLIPOPROTEIN B: Apolipoprotein B: 107 mg/dL — ABNORMAL HIGH (ref <90)

## 2022-03-16 LAB — LIPOPROTEIN A (LPA): Lipoprotein (a): 409.2 nmol/L — ABNORMAL HIGH (ref <75.0)

## 2022-03-16 LAB — HEMOGLOBIN A1C
Est. average glucose Bld gHb Est-mCnc: 105 mg/dL
Hgb A1c MFr Bld: 5.3 % (ref 4.8–5.6)

## 2022-03-21 ENCOUNTER — Telehealth: Payer: Self-pay

## 2022-03-21 DIAGNOSIS — E781 Pure hyperglyceridemia: Secondary | ICD-10-CM

## 2022-03-21 NOTE — Telephone Encounter (Signed)
-----   Message from Fay Records, MD sent at 03/17/2022  9:14 PM EDT ----- ?CBC is normal ?Lipids are high:  LDL (bad cholesterol) is 158  Triglycerides are also elevated 153 ?SOme reflects diet (recomm whole foods, low carb, lots veggies) ?Some is genetic    Lpa is a protein that carries LDL particles    Genetics determine that ?Echo is scheduled ?We discussed Calcium score CT to see if plaquing on arteries is present   I would recomm this   If she has plaquing would recomm lowering with medicine to prvent progression   ?

## 2022-03-21 NOTE — Telephone Encounter (Signed)
Called and spoke at length with patient who requires a lot of education on diet modification. Gave pt instruction to review Mediterranean diet-she states she will. Understands someone will call her to schedule CT. ?

## 2022-04-05 ENCOUNTER — Ambulatory Visit
Admission: RE | Admit: 2022-04-05 | Discharge: 2022-04-05 | Disposition: A | Discharge status: Home / Self Care | Payer: Self-pay | Source: Ambulatory Visit | Attending: Internal Medicine | Admitting: Internal Medicine

## 2022-04-05 ENCOUNTER — Ambulatory Visit (HOSPITAL_COMMUNITY): Payer: Medicare HMO | Attending: Cardiology

## 2022-04-05 DIAGNOSIS — R079 Chest pain, unspecified: Secondary | ICD-10-CM | POA: Diagnosis not present

## 2022-04-05 DIAGNOSIS — Z79899 Other long term (current) drug therapy: Secondary | ICD-10-CM | POA: Insufficient documentation

## 2022-04-05 DIAGNOSIS — R5383 Other fatigue: Secondary | ICD-10-CM | POA: Diagnosis not present

## 2022-04-05 DIAGNOSIS — E039 Hypothyroidism, unspecified: Secondary | ICD-10-CM | POA: Insufficient documentation

## 2022-04-05 DIAGNOSIS — E781 Pure hyperglyceridemia: Secondary | ICD-10-CM

## 2022-04-05 LAB — ECHOCARDIOGRAM COMPLETE
Area-P 1/2: 4.41 cm2
S' Lateral: 2.2 cm

## 2022-04-19 DIAGNOSIS — I83891 Varicose veins of right lower extremities with other complications: Secondary | ICD-10-CM | POA: Diagnosis not present

## 2022-06-21 ENCOUNTER — Ambulatory Visit
Admission: RE | Admit: 2022-06-21 | Discharge: 2022-06-21 | Disposition: A | Discharge status: Home / Self Care | Payer: Medicare HMO | Source: Ambulatory Visit | Attending: Obstetrics & Gynecology | Admitting: Obstetrics & Gynecology

## 2022-06-21 DIAGNOSIS — Z1231 Encounter for screening mammogram for malignant neoplasm of breast: Secondary | ICD-10-CM | POA: Diagnosis not present

## 2022-07-08 DIAGNOSIS — M797 Fibromyalgia: Secondary | ICD-10-CM | POA: Diagnosis not present

## 2022-07-08 DIAGNOSIS — F410 Panic disorder [episodic paroxysmal anxiety] without agoraphobia: Secondary | ICD-10-CM | POA: Diagnosis not present

## 2022-07-08 DIAGNOSIS — M19071 Primary osteoarthritis, right ankle and foot: Secondary | ICD-10-CM | POA: Diagnosis not present

## 2022-07-08 DIAGNOSIS — I10 Essential (primary) hypertension: Secondary | ICD-10-CM | POA: Diagnosis not present

## 2022-07-08 DIAGNOSIS — K589 Irritable bowel syndrome without diarrhea: Secondary | ICD-10-CM | POA: Diagnosis not present

## 2022-07-08 DIAGNOSIS — J301 Allergic rhinitis due to pollen: Secondary | ICD-10-CM | POA: Diagnosis not present

## 2022-07-08 DIAGNOSIS — F17210 Nicotine dependence, cigarettes, uncomplicated: Secondary | ICD-10-CM | POA: Diagnosis not present

## 2022-07-08 DIAGNOSIS — R69 Illness, unspecified: Secondary | ICD-10-CM | POA: Diagnosis not present

## 2022-07-08 DIAGNOSIS — K219 Gastro-esophageal reflux disease without esophagitis: Secondary | ICD-10-CM | POA: Diagnosis not present

## 2022-07-08 DIAGNOSIS — R4582 Worries: Secondary | ICD-10-CM | POA: Diagnosis not present

## 2022-07-08 DIAGNOSIS — E039 Hypothyroidism, unspecified: Secondary | ICD-10-CM | POA: Diagnosis not present

## 2022-07-08 DIAGNOSIS — E7849 Other hyperlipidemia: Secondary | ICD-10-CM | POA: Diagnosis not present

## 2022-07-08 DIAGNOSIS — F331 Major depressive disorder, recurrent, moderate: Secondary | ICD-10-CM | POA: Diagnosis not present

## 2022-07-19 DIAGNOSIS — M7989 Other specified soft tissue disorders: Secondary | ICD-10-CM | POA: Diagnosis not present

## 2022-07-19 DIAGNOSIS — M79604 Pain in right leg: Secondary | ICD-10-CM | POA: Diagnosis not present

## 2022-07-19 DIAGNOSIS — I83811 Varicose veins of right lower extremities with pain: Secondary | ICD-10-CM | POA: Diagnosis not present

## 2022-07-19 DIAGNOSIS — I872 Venous insufficiency (chronic) (peripheral): Secondary | ICD-10-CM | POA: Diagnosis not present

## 2022-07-27 DIAGNOSIS — L648 Other androgenic alopecia: Secondary | ICD-10-CM | POA: Diagnosis not present

## 2022-07-27 DIAGNOSIS — L650 Telogen effluvium: Secondary | ICD-10-CM | POA: Diagnosis not present

## 2022-08-22 DIAGNOSIS — J320 Chronic maxillary sinusitis: Secondary | ICD-10-CM | POA: Diagnosis not present

## 2022-08-22 DIAGNOSIS — H9201 Otalgia, right ear: Secondary | ICD-10-CM | POA: Diagnosis not present

## 2022-08-22 DIAGNOSIS — Z6824 Body mass index (BMI) 24.0-24.9, adult: Secondary | ICD-10-CM | POA: Diagnosis not present

## 2022-08-22 NOTE — Progress Notes (Unsigned)
Name: Melinda Cannon  MRN/ DOB: [id number], [birth year]    Age/ Sex: 46 y.o., female     PCP: Melinda Bis, MD   Reason for Endocrinology Evaluation: Postablative hypothyroidism     Initial Endocrinology Clinic Visit: 07/18/2021    PATIENT IDENTIFIER: Melinda Cannon is a 46 y.o., female with a past medical history of  hypothyroidism, dyslipidemia and HTN . She has followed with [facility] since 07/18/2021 for consultative assistance with management of her Hypothyroidism.   HISTORICAL SUMMARY:   She was diagnosed with MNG in 2010 . She is S/P Left FNA with cytology report of abundant lymphoid tissue and rare benign appearing follicular epithelial cells.  She is S/P thyroidectomy ~ 16 yrs ago . She is not sure if the whole thyroid taken out or partial .    Continues with dry skin  Has occasional palpitations  Denies tremors.     She was on levothyroxine until 2021 and was switched to Synthroid  She has noted hair loss 2019 , she had to resort to wearing a wig    Postmenopause History : She has been having hot flashes in 2022. [place] elevated at 98.4 mIU/mL . Saw Gyn and was started on Estrogen patch , she noted lightheadedness and rash so she stopped it with f/u with Gyn.   At the time she was c/o heat intolerance to a near syncope followed by cold intolerance  Has hx of hysterectomy   Saw two dermatologists , thought hair loss  to be related to hard water. Moved from that house and now living  in the [place] but did not help.   She was also  c/o facial wrinkles, premature aging and greying of hair     SUBJECTIVE:    Today (08/22/2022):  Ms. Vary is here for postablative hypothyroidism.     Weight stable over the past 6 months  She continues with hair loss  She has fatigue and would like B12 is checked  Sleeping is variable  Saw Gyn and was started on MVI , she does not want to be on Estradiol due to risk of cancer     Levothyroxine 80mg  , daily     HISTORY:  Past Medical History:  Past Medical History:  Diagnosis Date   Anxiety    Fibromyalgia    GERD (gastroesophageal reflux disease)    H/O calcium pyrophosphate deposition disease (CPPD)    causes migraines   H/O hiatal hernia    Headache(784.0)    Hypertension    Hypothyroidism    Kidney stones    Past Surgical History:  Past Surgical History:  Procedure Laterality Date   ABDOMINAL HYSTERECTOMY     CHOLECYSTECTOMY     LAPAROSCOPY Bilateral 06/05/2014   Procedure: REMOVAL OF LESION ON VAGINAL CUFF,  LEFT SALPINGO-OOPHORECTOMY, RIGHT SALPINGECTOMY.;  Surgeon: Melinda Hedges DO;  [facility];  Service: Gynecology;  Laterality: Bilateral;   LITHOTRIPSY     TOTAL THYROIDECTOMY     Social History:  reports that she has quit smoking. Her smoking use included cigarettes. She has a 12.50 pack-year smoking history. She has never used smokeless tobacco. She reports that she does not drink alcohol and does not use drugs. Family History: No family history on file.   HOME MEDICATIONS: Allergies as of 08/23/2022       Reactions   Ciprofloxacin Rash, Other (See Comments)   Made her feel faint   Codeine Rash   Penicillins Rash  Medication List        Accurate as of August 22, 2022  9:42 AM. If you have any questions, ask your nurse or doctor.          ALPRAZolam 0.25 MG tablet Commonly known as: XANAX Take 0.25 mg by mouth 3 (three) times daily as needed for anxiety.   dexlansoprazole 60 MG capsule Commonly known as: DEXILANT Take 60 mg by mouth daily.   levothyroxine 88 MCG tablet Commonly known as: Synthroid Take 1 tablet (88 mcg total) by mouth daily before breakfast.   MULTI-VITAMIN MENOPAUSAL PO Take by mouth.          OBJECTIVE:   PHYSICAL EXAM: VS: There were no vitals taken for this visit.   EXAM: General: Pt appears well and is in NAD  Neck: General: Supple without adenopathy. Thyroid: No goiter or nodules  appreciated.   Lungs: Clear with good BS bilat with no rales, rhonchi, or wheezes  Heart: Auscultation: RRR.  Abdomen: Normoactive bowel sounds, soft, nontender, without masses or organomegaly palpable  Extremities:  BL LE: No pretibial edema normal ROM and strength.  Mental Status: Judgment, insight: Intact Orientation: Oriented to time, place, and person Mood and affect: No depression, anxiety, or agitation     DATA REVIEWED:  Latest Reference Range & Units 02/17/22 09:18  TSH 0.35 - 5.50 uIU/mL 0.50  T4,Free(Direct) 0.60 - 1.60 ng/dL 1.24    Latest Reference Range & Units 02/17/22 09:18  VITD 30.00 - 100.00 ng/mL 29.32 (L)  Vitamin B12 211 - 911 pg/mL 897    ASSESSMENT / PLAN / RECOMMENDATIONS:    Postoperative Hypothyroidism :   -Patient is clinically thyroid -No local neck symptoms -TSH have normalized, no changes   Medications :   Synthroid 88 mcg, daily          Follow-up 6 months Labs in 3 months  Signed electronically by: Melinda Guise, MD  [facility]  [facility] [address], [place] [address] Phone: [phone] FAX: [phone]      CC: Melinda Bis, MD [place] [address] Phone: [phone]  Fax: [phone]   Return to Endocrinology clinic as below: Future Appointments  Date Time Provider [place]  08/23/2022  8:10 AM Melinda Cannon, Melinda Crazier, MD [facility] None

## 2022-08-23 ENCOUNTER — Telehealth: Payer: Medicare HMO | Admitting: Internal Medicine

## 2022-08-23 ENCOUNTER — Encounter: Payer: Self-pay | Admitting: Internal Medicine

## 2022-08-23 VITALS — BP 124/72 | HR 81 | Ht 62.0 in | Wt 136.4 lb

## 2022-08-23 DIAGNOSIS — E890 Postprocedural hypothyroidism: Secondary | ICD-10-CM | POA: Diagnosis not present

## 2022-08-23 DIAGNOSIS — R0781 Pleurodynia: Secondary | ICD-10-CM | POA: Insufficient documentation

## 2022-08-23 LAB — T4, FREE: Free T4: 1.03 ng/dL (ref 0.60–1.60)

## 2022-08-23 LAB — TSH: TSH: 2.34 u[IU]/mL (ref 0.35–5.50)

## 2022-08-23 NOTE — Patient Instructions (Signed)
Check out  tietze syndrome

## 2022-08-24 MED ORDER — SYNTHROID 88 MCG PO TABS: 88.0000 ug | ORAL_TABLET | Freq: Every day | ORAL | 3 refills | Status: DC

## 2022-09-12 ENCOUNTER — Ambulatory Visit: Payer: Medicare HMO | Admitting: Dermatology

## 2022-11-28 DIAGNOSIS — L988 Other specified disorders of the skin and subcutaneous tissue: Secondary | ICD-10-CM | POA: Diagnosis not present

## 2022-11-28 DIAGNOSIS — L650 Telogen effluvium: Secondary | ICD-10-CM | POA: Diagnosis not present

## 2022-11-28 DIAGNOSIS — L648 Other androgenic alopecia: Secondary | ICD-10-CM | POA: Diagnosis not present

## 2023-01-18 DIAGNOSIS — I83811 Varicose veins of right lower extremities with pain: Secondary | ICD-10-CM | POA: Diagnosis not present

## 2023-01-18 DIAGNOSIS — I83891 Varicose veins of right lower extremities with other complications: Secondary | ICD-10-CM | POA: Diagnosis not present

## 2023-01-24 DIAGNOSIS — L650 Telogen effluvium: Secondary | ICD-10-CM | POA: Diagnosis not present

## 2023-01-24 DIAGNOSIS — L648 Other androgenic alopecia: Secondary | ICD-10-CM | POA: Diagnosis not present

## 2023-02-06 DIAGNOSIS — Z0100 Encounter for examination of eyes and vision without abnormal findings: Secondary | ICD-10-CM | POA: Diagnosis not present

## 2023-02-06 DIAGNOSIS — H524 Presbyopia: Secondary | ICD-10-CM | POA: Diagnosis not present

## 2023-02-14 DIAGNOSIS — Z01419 Encounter for gynecological examination (general) (routine) without abnormal findings: Secondary | ICD-10-CM | POA: Diagnosis not present

## 2023-02-14 DIAGNOSIS — N959 Unspecified menopausal and perimenopausal disorder: Secondary | ICD-10-CM | POA: Diagnosis not present

## 2023-02-14 DIAGNOSIS — Z6827 Body mass index (BMI) 27.0-27.9, adult: Secondary | ICD-10-CM | POA: Diagnosis not present

## 2023-02-15 ENCOUNTER — Other Ambulatory Visit: Payer: Self-pay | Admitting: Obstetrics & Gynecology

## 2023-02-15 DIAGNOSIS — N6320 Unspecified lump in the left breast, unspecified quadrant: Secondary | ICD-10-CM

## 2023-02-28 ENCOUNTER — Ambulatory Visit: Payer: Medicare HMO | Admitting: Internal Medicine

## 2023-02-28 ENCOUNTER — Encounter: Payer: Self-pay | Admitting: Internal Medicine

## 2023-02-28 VITALS — BP 122/80 | HR 89 | Ht 62.0 in | Wt 149.2 lb

## 2023-02-28 DIAGNOSIS — R635 Abnormal weight gain: Secondary | ICD-10-CM

## 2023-02-28 DIAGNOSIS — E890 Postprocedural hypothyroidism: Secondary | ICD-10-CM | POA: Diagnosis not present

## 2023-02-28 DIAGNOSIS — N959 Unspecified menopausal and perimenopausal disorder: Secondary | ICD-10-CM

## 2023-02-28 LAB — T4, FREE: Free T4: 1.27 ng/dL (ref 0.60–1.60)

## 2023-02-28 LAB — TSH: TSH: 1.23 u[IU]/mL (ref 0.35–5.50)

## 2023-02-28 LAB — FOLLICLE STIMULATING HORMONE: FSH: 62.9 m[IU]/mL

## 2023-02-28 MED ORDER — SYNTHROID 88 MCG PO TABS: 88.0000 ug | ORAL_TABLET | Freq: Every day | ORAL | 3 refills | Status: DC

## 2023-02-28 NOTE — Progress Notes (Signed)
Name: Melinda Cannon  MRN/ DOB: [id number], 21-Jun-1976    Age/ Sex: 47 y.o., female     PCP: Melinda Chimera, MD   Reason for Endocrinology Evaluation: Postablative hypothyroidism     Initial Endocrinology Clinic Visit: 07/18/2021    PATIENT IDENTIFIER: Melinda Cannon is a 47 y.o., female with a past medical history of  hypothyroidism, dyslipidemia and HTN . She has followed with [facility] since 07/18/2021 for consultative assistance with management of her Hypothyroidism.   HISTORICAL SUMMARY:   She was diagnosed with MNG in 2010 . She is S/P Left FNA with cytology report of abundant lymphoid tissue and rare benign appearing follicular epithelial cells.  She is S/P thyroidectomy ~ 16 yrs ago . She is not sure if the whole thyroid taken out or partial .     She was on levothyroxine until 2021 and was switched to Synthroid  She has noted hair loss 2019 , she had to resort to wearing a wig    Postmenopause History : She has been having hot flashes in 2022. FSH elevated at 98.4 mIU/mL . Saw Gyn and was started on Estrogen patch , she noted lightheadedness and rash so she stopped it with f/u with Gyn.   At the time she was c/o heat intolerance to a near syncope followed by cold intolerance  Has hx of hysterectomy   Saw two dermatologists ,  hair loss was attributed to be to hard water.  But relocating with change in water supply did not improve hair loss  She was c/o facial wrinkles, premature aging and greying of hair   SUBJECTIVE:    Today (02/28/2023):  Melinda Cannon is here for postablative hypothyroidism.    She had a follow-up with [facility] (Dr. Mitchel Cannon) on 02/14/2023, she was started on estradiol patch , continues with hot flahses , attributes arthralgia to it  She was seen by [facility] dermatology 11/28/2018 for androgenic alopecia She quit smoking 03/2022 and has noted increased weight  Continues with chronic constipation,  attributes worsening to spironolactone  Denies palpitations     Levothyroxine , daily     HISTORY:  Past Medical History:  Past Medical History:  Diagnosis Date   Anxiety    Fibromyalgia    GERD (gastroesophageal reflux disease)    H/O calcium pyrophosphate deposition disease (CPPD)    causes migraines   H/O hiatal hernia    Headache(784.0)    Hypertension    Hypothyroidism    Kidney stones    Past Surgical History:  Past Surgical History:  Procedure Laterality Date   ABDOMINAL HYSTERECTOMY     CHOLECYSTECTOMY     LAPAROSCOPY Bilateral 06/05/2014   Procedure: REMOVAL OF LESION ON VAGINAL CUFF,  LEFT SALPINGO-OOPHORECTOMY, RIGHT SALPINGECTOMY.;  Surgeon: Melinda Honour, DO;  Location: [facility];  Service: Gynecology;  Laterality: Bilateral;   LITHOTRIPSY     TOTAL THYROIDECTOMY     Social History:  reports that she has quit smoking. Her smoking use included cigarettes. She has a 12.50 pack-year smoking history. She has never used smokeless tobacco. She reports that she does not drink alcohol and does not use drugs. Family History: No family history on file.   HOME MEDICATIONS: Allergies as of 02/28/2023       Reactions   Ciprofloxacin Rash, Other (See Comments)   Made her feel faint   Codeine Rash   Penicillins Rash        Medication List  Accurate as of February 28, 2023  8:29 AM. If you have any questions, ask your nurse or doctor.          ALPRAZolam 0.25 MG tablet Commonly known as: XANAX Take 0.25 mg by mouth 3 (three) times daily as needed for anxiety.   dexlansoprazole 60 MG capsule Commonly known as: DEXILANT Take 60 mg by mouth daily.   estradiol 0.025 mg/24hr patch Commonly known as: CLIMARA - Dosed in mg/24 hr APPLY 1 PATCH BY TRANSDERMAL ROUTE EVERY WEEK   MULTI-VITAMIN MENOPAUSAL PO Take by mouth.   Synthroid 88 MCG tablet Generic drug: levothyroxine Take 1 tablet (88 mcg total) by mouth daily before breakfast.           OBJECTIVE:   PHYSICAL EXAM: VS: BP 122/80 (BP Location: Right Arm, Patient Position: Sitting, Cuff Size: Normal)   Pulse 89   Ht  (1.575 m)   Wt 149 lb 3.2 oz (67.7 kg)   SpO2 99%   BMI 27.29 kg/m    EXAM: General: Pt appears well and is in NAD  Neck: General: Supple without adenopathy. Thyroid: No goiter or nodules appreciated.   Lungs: Clear with good BS bilat with no rales, rhonchi, or wheezes  Heart: Auscultation: RRR.  Abdomen: Soft, nontender  Extremities:  BL LE: No pretibial edema normal ROM and strength.  Mental Status: Judgment, insight: Intact Orientation: Oriented to time, place, and person Mood and affect: No depression, anxiety, or agitation     DATA REVIEWED:   Latest Reference Range & Units 02/28/23 08:44  FSH mIU/ML 62.9  TSH 0.35 - 5.50 uIU/mL 1.23  T4,Free(Direct) 0.60 - 1.60 ng/dL 1.61   ASSESSMENT / PLAN / RECOMMENDATIONS:    Postoperative Hypothyroidism :   -Patient is clinically thyroid -No local neck symptoms -TSH continues to be normal    Medications :   Continue Synthroid 88 mcg, daily    2. Postmenopausal:  -This has been managed by GYN, she was started on a small dose of estradiol patch -She continues to have flashes -Patient would like for me to test estradiol -FSH elevated, estradiol pending today   3.  Weight gain:  -She has gained approximately 11 pounds over the past year, she attributes this to tobacco cessation, patient admits to dietary indiscretions -We discussed importance of eating proper well-balanced and healthy meals and avoiding snacks   Follow-up 1 yr    Signed electronically by: Melinda Herrlich, MD  [facility]  [facility] [address] [place] [place], [place] [address] Phone: [phone] FAX: [phone]      CC: Melinda Chimera, MD [address] [place] [address] Phone: [phone]  Fax: [phone]   Return to Endocrinology clinic as  below: Future Appointments  Date Time Provider Department Center  04/06/2023  9:40 AM [facility] DIAG [place] 1 [facility]  04/06/2023  9:50 AM [facility] [place] 1 [facility]

## 2023-03-01 LAB — ESTRADIOL: Estradiol: 23 pg/mL

## 2023-04-06 ENCOUNTER — Other Ambulatory Visit: Payer: Medicare HMO

## 2023-05-09 ENCOUNTER — Other Ambulatory Visit: Payer: Medicare HMO

## 2023-06-20 ENCOUNTER — Other Ambulatory Visit: Payer: Medicare HMO

## 2023-06-22 ENCOUNTER — Telehealth: Payer: Self-pay | Admitting: Internal Medicine

## 2023-06-22 DIAGNOSIS — E890 Postprocedural hypothyroidism: Secondary | ICD-10-CM

## 2023-06-22 NOTE — Telephone Encounter (Signed)
Patient called to advise that she is being treated by her OB-GYN for menopause. She is using an Estradiol patch and starting to "feel" like her Thyroid is off.  Per patient [facility] pharmacist advised her that the Estradiol may have a impact on the efficacy of her thyroid medication. Patient asking about this and whether or not she needs her thyroid levels checked   Per patient can replay via mychart message or call

## 2023-06-25 NOTE — Telephone Encounter (Signed)
Per review of records, she was already on the estrogen patch at the last visit with Dr. Lonzo Cloud, in 02/2023 and her TSH was normal.  However, we can definitely recheck the TSH.  Can you order this and have her come back for labs?  Please make sure she is not taking any biotin (B complex, hair skin and nails vitamins) or steroids.

## 2023-06-25 NOTE — Telephone Encounter (Signed)
Patient advised and would like to hold off on the lab until Dr. Lonzo Cloud is back and see what she advises.

## 2023-06-27 NOTE — Addendum Note (Signed)
Addended by: Lisabeth Pick on: 06/27/2023 10:53 AM   Modules accepted: Orders

## 2023-06-29 ENCOUNTER — Ambulatory Visit
Admission: RE | Admit: 2023-06-29 | Discharge: 2023-06-29 | Disposition: A | Discharge status: Home / Self Care | Payer: Medicare HMO | Source: Ambulatory Visit | Attending: Obstetrics & Gynecology | Admitting: Obstetrics & Gynecology

## 2023-06-29 ENCOUNTER — Encounter: Payer: Self-pay | Admitting: Family Medicine

## 2023-06-29 DIAGNOSIS — N6320 Unspecified lump in the left breast, unspecified quadrant: Secondary | ICD-10-CM

## 2023-06-29 DIAGNOSIS — D242 Benign neoplasm of left breast: Secondary | ICD-10-CM | POA: Diagnosis not present

## 2023-06-29 DIAGNOSIS — N644 Mastodynia: Secondary | ICD-10-CM | POA: Diagnosis not present

## 2023-07-04 ENCOUNTER — Other Ambulatory Visit (INDEPENDENT_AMBULATORY_CARE_PROVIDER_SITE_OTHER): Payer: Medicare HMO

## 2023-07-04 DIAGNOSIS — E890 Postprocedural hypothyroidism: Secondary | ICD-10-CM | POA: Diagnosis not present

## 2023-07-04 LAB — TSH: TSH: 3.59 u[IU]/mL (ref 0.35–5.50)

## 2023-07-26 DIAGNOSIS — N959 Unspecified menopausal and perimenopausal disorder: Secondary | ICD-10-CM | POA: Diagnosis not present

## 2023-08-04 DIAGNOSIS — K589 Irritable bowel syndrome without diarrhea: Secondary | ICD-10-CM | POA: Diagnosis not present

## 2023-08-04 DIAGNOSIS — F410 Panic disorder [episodic paroxysmal anxiety] without agoraphobia: Secondary | ICD-10-CM | POA: Diagnosis not present

## 2023-08-04 DIAGNOSIS — M797 Fibromyalgia: Secondary | ICD-10-CM | POA: Diagnosis not present

## 2023-08-04 DIAGNOSIS — I10 Essential (primary) hypertension: Secondary | ICD-10-CM | POA: Diagnosis not present

## 2023-08-04 DIAGNOSIS — K219 Gastro-esophageal reflux disease without esophagitis: Secondary | ICD-10-CM | POA: Diagnosis not present

## 2023-08-04 DIAGNOSIS — E039 Hypothyroidism, unspecified: Secondary | ICD-10-CM | POA: Diagnosis not present

## 2023-08-04 DIAGNOSIS — M19071 Primary osteoarthritis, right ankle and foot: Secondary | ICD-10-CM | POA: Diagnosis not present

## 2023-08-04 DIAGNOSIS — Z0001 Encounter for general adult medical examination with abnormal findings: Secondary | ICD-10-CM | POA: Diagnosis not present

## 2023-08-04 DIAGNOSIS — F331 Major depressive disorder, recurrent, moderate: Secondary | ICD-10-CM | POA: Diagnosis not present

## 2023-08-04 DIAGNOSIS — J301 Allergic rhinitis due to pollen: Secondary | ICD-10-CM | POA: Diagnosis not present

## 2023-08-04 DIAGNOSIS — Z6826 Body mass index (BMI) 26.0-26.9, adult: Secondary | ICD-10-CM | POA: Diagnosis not present

## 2023-08-04 DIAGNOSIS — E7849 Other hyperlipidemia: Secondary | ICD-10-CM | POA: Diagnosis not present

## 2023-09-19 DIAGNOSIS — M79604 Pain in right leg: Secondary | ICD-10-CM | POA: Diagnosis not present

## 2023-09-19 DIAGNOSIS — I872 Venous insufficiency (chronic) (peripheral): Secondary | ICD-10-CM | POA: Diagnosis not present

## 2023-11-20 DIAGNOSIS — L650 Telogen effluvium: Secondary | ICD-10-CM | POA: Diagnosis not present

## 2023-11-20 DIAGNOSIS — L648 Other androgenic alopecia: Secondary | ICD-10-CM | POA: Diagnosis not present

## 2023-11-20 DIAGNOSIS — L218 Other seborrheic dermatitis: Secondary | ICD-10-CM | POA: Diagnosis not present

## 2023-12-10 DIAGNOSIS — L648 Other androgenic alopecia: Secondary | ICD-10-CM | POA: Diagnosis not present

## 2023-12-10 DIAGNOSIS — L239 Allergic contact dermatitis, unspecified cause: Secondary | ICD-10-CM | POA: Diagnosis not present

## 2023-12-11 DIAGNOSIS — L680 Hirsutism: Secondary | ICD-10-CM | POA: Diagnosis not present

## 2023-12-11 DIAGNOSIS — R319 Hematuria, unspecified: Secondary | ICD-10-CM | POA: Diagnosis not present

## 2023-12-11 DIAGNOSIS — R309 Painful micturition, unspecified: Secondary | ICD-10-CM | POA: Diagnosis not present

## 2023-12-13 DIAGNOSIS — L239 Allergic contact dermatitis, unspecified cause: Secondary | ICD-10-CM | POA: Diagnosis not present

## 2023-12-13 DIAGNOSIS — L648 Other androgenic alopecia: Secondary | ICD-10-CM | POA: Diagnosis not present

## 2023-12-13 DIAGNOSIS — L650 Telogen effluvium: Secondary | ICD-10-CM | POA: Diagnosis not present

## 2024-02-13 DIAGNOSIS — L648 Other androgenic alopecia: Secondary | ICD-10-CM | POA: Diagnosis not present

## 2024-02-13 DIAGNOSIS — L650 Telogen effluvium: Secondary | ICD-10-CM | POA: Diagnosis not present

## 2024-02-25 ENCOUNTER — Other Ambulatory Visit: Payer: Self-pay | Admitting: Family Medicine

## 2024-02-25 DIAGNOSIS — M5126 Other intervertebral disc displacement, lumbar region: Secondary | ICD-10-CM

## 2024-02-27 ENCOUNTER — Encounter: Payer: Self-pay | Admitting: Internal Medicine

## 2024-02-27 ENCOUNTER — Ambulatory Visit: Payer: Medicare HMO | Admitting: Internal Medicine

## 2024-02-27 VITALS — BP 122/76 | HR 87 | Ht 62.0 in | Wt 131.0 lb

## 2024-02-27 DIAGNOSIS — E890 Postprocedural hypothyroidism: Secondary | ICD-10-CM | POA: Diagnosis not present

## 2024-02-27 LAB — T4, FREE: Free T4: 1.5 ng/dL (ref 0.8–1.8)

## 2024-02-27 LAB — TSH: TSH: 1.07 m[IU]/L

## 2024-02-27 NOTE — Progress Notes (Unsigned)
 Name: Melinda Cannon  MRN/ DOB: [id number], [birth year]    Age/ Sex: 48 y.o., female     PCP: Leesa Pulling, MD   Reason for Endocrinology Evaluation: Postablative hypothyroidism     Initial Endocrinology Clinic Visit: 07/18/2021    PATIENT IDENTIFIER: Melinda Cannon is a 48 y.o., female with a past medical history of  hypothyroidism, dyslipidemia and HTN . She has followed with [facility] since 07/18/2021 for consultative assistance with management of her Hypothyroidism.   HISTORICAL SUMMARY:   She was diagnosed with MNG in 2010 . She is S/P Left FNA with cytology report of abundant lymphoid tissue and rare benign appearing follicular epithelial cells.  She is S/P thyroidectomy ~ 16 yrs ago . She is not sure if the whole thyroid  taken out or partial .     She was on levothyroxine  until 2021 and was switched to Synthroid   She has noted hair loss 2019 , she had to resort to wearing a wig    Postmenopause History : She has been having hot flashes in 2022. FSH elevated at 98.4 mIU/mL . Saw Gyn and was started on Estrogen patch , she noted lightheadedness and rash so she stopped it with f/u with Gyn.   At the time she was c/o heat intolerance to a near syncope followed by cold intolerance  Has hx of hysterectomy   Saw two dermatologists ,  hair loss was attributed to be to hard water.  But relocating with change in water supply did not improve hair loss  She was c/o facial wrinkles, premature aging and greying of hair   SUBJECTIVE:    Today (02/27/2024):  Melinda Cannon is here for postablative hypothyroidism.    She was seen by [facility] dermatology 11/28/2018 for androgenic alopecia She quit smoking 03/2022  Continues with Gyn, on estrogen and testosterone    Denies local neck swelling  Palpitations are improving  Continues with chronic constipation, on colace  No tremors  No anxiety or depression    Synthroid   88 mcg , daily     HISTORY:  Past  Medical History:  Past Medical History:  Diagnosis Date   Anxiety    Fibromyalgia    GERD (gastroesophageal reflux disease)    H/O calcium pyrophosphate deposition disease (CPPD)    causes migraines   H/O hiatal hernia    Headache(784.0)    Hypertension    Hypothyroidism    Kidney stones    Past Surgical History:  Past Surgical History:  Procedure Laterality Date   ABDOMINAL HYSTERECTOMY     CHOLECYSTECTOMY     LAPAROSCOPY Bilateral 06/05/2014   Procedure: REMOVAL OF LESION ON VAGINAL CUFF,  LEFT SALPINGO-OOPHORECTOMY, RIGHT SALPINGECTOMY.;  Surgeon: Dyanna Glasgow, DO;  Location: [facility];  Service: Gynecology;  Laterality: Bilateral;   LITHOTRIPSY     TOTAL THYROIDECTOMY     Social History:  reports that she has quit smoking. Her smoking use included cigarettes. She has a 12.5 pack-year smoking history. She has never used smokeless tobacco. She reports that she does not drink alcohol and does not use drugs. Family History: No family history on file.   HOME MEDICATIONS: Allergies as of 02/27/2024       Reactions   Ciprofloxacin Rash, Other (See Comments)   Made her feel faint   Codeine Rash   Penicillins Rash        Medication List        Accurate as of February 27, 2024  8:29 AM. If you have any questions, ask your nurse or doctor.          ALPRAZolam  0.25 MG tablet Commonly known as: XANAX  Take 0.25 mg by mouth 3 (three) times daily as needed for anxiety.   Ciclopirox 1 % shampoo USE IN THE SHOWER ON THE SCALP 3 X A WEEKLY AND CAN USE IN THE BEARD AREA DAILY   dexlansoprazole 60 MG capsule Commonly known as: DEXILANT Take 60 mg by mouth daily.   estradiol  0.1 mg/24hr patch Commonly known as: CLIMARA  - Dosed in mg/24 hr APPLY 1 PATCH BY TRANSDERMAL ROUTE EVERY WEEK   estradiol  0.025 mg/24hr patch Commonly known as: CLIMARA  - Dosed in mg/24 hr APPLY 1 PATCH BY TRANSDERMAL ROUTE EVERY WEEK   MULTI-VITAMIN MENOPAUSAL PO Take by mouth.   progesterone 100  MG capsule Commonly known as: PROMETRIUM TAKE 1 CAPSULE BY MOUTH EVERYDAY AT BEDTIME   sulfamethoxazole -trimethoprim  800-160 MG tablet Commonly known as: BACTRIM  DS Take 1 tablet by mouth every 12 (twelve) hours.   Synthroid  88 MCG tablet Generic drug: levothyroxine  Take 1 tablet (88 mcg total) by mouth daily before breakfast.          OBJECTIVE:   PHYSICAL EXAM: VS: BP 122/76 (BP Location: Left Arm, Patient Position: Sitting, Cuff Size: Small)   Pulse 87   Ht 5\' 2"  (1.575 m)   Wt 131 lb (59.4 kg)   SpO2 95%   BMI 23.96 kg/m    EXAM: General: Pt appears well and is in NAD  Neck: General: Supple without adenopathy. Thyroid : No goiter or nodules appreciated.   Lungs: Clear with good BS bilat   Heart: Auscultation: RRR.  Abdomen: Soft, nontender  Extremities:  BL LE: No pretibial edema   Mental Status: Judgment, insight: Intact Orientation: Oriented to time, place, and person Mood and affect: No depression, anxiety, or agitation     DATA REVIEWED:  ***** ASSESSMENT / PLAN / RECOMMENDATIONS:    Postoperative Hypothyroidism :   -Patient is clinically thyroid  -No local neck symptoms -TSH continues to be normal    Medications :   Continue Synthroid  88 mcg, daily    Follow-up 1 yr    Signed electronically by: Natale Bail, MD  [facility] Endocrin[facility]  [facility] [address] [place] [place], [place] [address] Phone: [phone] FAX: [phone]      CC: Leesa Pulling, MD [address] [place] [address] Phone: [phone]  Fax: [phone]   Return to Endocrinology clinic as below: No future appointments.

## 2024-02-27 NOTE — Patient Instructions (Addendum)
 Try Magnesium Citrate for constipation     You are on levothyroxine  - which is your thyroid  hormone supplement. You MUST take this consistently.  You should take this first thing in the morning on an empty stomach with water. You should not take it with other medications. Wait to 1hr prior to eating. If you are taking any vitamins - please take these in the evening.   If you miss a dose, please take your missed dose the following day (double the dose for that day). You should have a pill box for ONLY levothyroxine  on your bedside table to help you remember to take your medications.

## 2024-02-28 ENCOUNTER — Encounter: Payer: Self-pay | Admitting: Internal Medicine

## 2024-02-28 MED ORDER — SYNTHROID 88 MCG PO TABS: 88.0000 ug | ORAL_TABLET | Freq: Every day | ORAL | 3 refills | Status: DC

## 2024-03-04 DIAGNOSIS — Z6823 Body mass index (BMI) 23.0-23.9, adult: Secondary | ICD-10-CM | POA: Diagnosis not present

## 2024-03-04 DIAGNOSIS — Z1151 Encounter for screening for human papillomavirus (HPV): Secondary | ICD-10-CM | POA: Diagnosis not present

## 2024-03-04 DIAGNOSIS — Z1272 Encounter for screening for malignant neoplasm of vagina: Secondary | ICD-10-CM | POA: Diagnosis not present

## 2024-03-04 DIAGNOSIS — Z01419 Encounter for gynecological examination (general) (routine) without abnormal findings: Secondary | ICD-10-CM | POA: Diagnosis not present

## 2024-03-04 DIAGNOSIS — N771 Vaginitis, vulvitis and vulvovaginitis in diseases classified elsewhere: Secondary | ICD-10-CM | POA: Diagnosis not present

## 2024-03-04 DIAGNOSIS — N760 Acute vaginitis: Secondary | ICD-10-CM | POA: Diagnosis not present

## 2024-03-04 DIAGNOSIS — R3915 Urgency of urination: Secondary | ICD-10-CM | POA: Diagnosis not present

## 2024-03-07 ENCOUNTER — Other Ambulatory Visit: Payer: Self-pay | Admitting: Internal Medicine

## 2024-03-08 ENCOUNTER — Other Ambulatory Visit: Payer: Self-pay | Admitting: Internal Medicine

## 2024-03-13 DIAGNOSIS — B351 Tinea unguium: Secondary | ICD-10-CM | POA: Diagnosis not present

## 2024-03-13 DIAGNOSIS — I10 Essential (primary) hypertension: Secondary | ICD-10-CM | POA: Diagnosis not present

## 2024-03-13 DIAGNOSIS — Z6824 Body mass index (BMI) 24.0-24.9, adult: Secondary | ICD-10-CM | POA: Diagnosis not present

## 2024-03-13 DIAGNOSIS — M542 Cervicalgia: Secondary | ICD-10-CM | POA: Diagnosis not present

## 2024-03-13 DIAGNOSIS — H524 Presbyopia: Secondary | ICD-10-CM | POA: Diagnosis not present

## 2024-03-17 DIAGNOSIS — I10 Essential (primary) hypertension: Secondary | ICD-10-CM | POA: Diagnosis not present

## 2024-03-18 DIAGNOSIS — K219 Gastro-esophageal reflux disease without esophagitis: Secondary | ICD-10-CM | POA: Diagnosis not present

## 2024-03-18 DIAGNOSIS — K5909 Other constipation: Secondary | ICD-10-CM | POA: Diagnosis not present

## 2024-03-18 DIAGNOSIS — Z1211 Encounter for screening for malignant neoplasm of colon: Secondary | ICD-10-CM | POA: Diagnosis not present

## 2024-05-31 ENCOUNTER — Other Ambulatory Visit: Payer: Self-pay | Admitting: Internal Medicine

## 2024-06-03 DIAGNOSIS — B351 Tinea unguium: Secondary | ICD-10-CM | POA: Diagnosis not present

## 2024-06-03 DIAGNOSIS — M542 Cervicalgia: Secondary | ICD-10-CM | POA: Diagnosis not present

## 2024-07-02 DIAGNOSIS — M542 Cervicalgia: Secondary | ICD-10-CM | POA: Diagnosis not present

## 2024-07-02 DIAGNOSIS — K449 Diaphragmatic hernia without obstruction or gangrene: Secondary | ICD-10-CM | POA: Diagnosis not present

## 2024-07-02 DIAGNOSIS — M1120 Other chondrocalcinosis, unspecified site: Secondary | ICD-10-CM | POA: Diagnosis not present

## 2024-07-02 DIAGNOSIS — R42 Dizziness and giddiness: Secondary | ICD-10-CM | POA: Diagnosis not present

## 2024-07-02 DIAGNOSIS — I10 Essential (primary) hypertension: Secondary | ICD-10-CM | POA: Diagnosis not present

## 2024-07-02 DIAGNOSIS — E559 Vitamin D deficiency, unspecified: Secondary | ICD-10-CM | POA: Diagnosis not present

## 2024-07-02 DIAGNOSIS — G43909 Migraine, unspecified, not intractable, without status migrainosus: Secondary | ICD-10-CM | POA: Diagnosis not present

## 2024-10-22 DIAGNOSIS — B351 Tinea unguium: Secondary | ICD-10-CM | POA: Diagnosis not present

## 2024-10-22 DIAGNOSIS — Z1212 Encounter for screening for malignant neoplasm of rectum: Secondary | ICD-10-CM | POA: Diagnosis not present

## 2024-10-22 DIAGNOSIS — Z1211 Encounter for screening for malignant neoplasm of colon: Secondary | ICD-10-CM | POA: Diagnosis not present

## 2024-10-28 LAB — COLOGUARD: COLOGUARD: POSITIVE — AB

## 2024-11-10 ENCOUNTER — Encounter: Payer: Self-pay | Admitting: Internal Medicine

## 2025-02-25 ENCOUNTER — Ambulatory Visit: Admitting: Internal Medicine
